# Patient Record
Sex: Female | Born: 1970
Health system: Southern US, Community
[De-identification: ages and names within clinical notes are randomized; demographics above are authoritative.]

## PROBLEM LIST (undated history)

## (undated) ENCOUNTER — Ambulatory Visit

## (undated) DIAGNOSIS — M65311 Trigger thumb, right thumb: Secondary | ICD-10-CM

## (undated) DIAGNOSIS — G5601 Carpal tunnel syndrome, right upper limb: Secondary | ICD-10-CM

## (undated) DIAGNOSIS — R251 Tremor, unspecified: Secondary | ICD-10-CM

## (undated) DIAGNOSIS — K219 Gastro-esophageal reflux disease without esophagitis: Secondary | ICD-10-CM

## (undated) HISTORY — PX: OTHER SURGICAL HISTORY: SHX169

## (undated) HISTORY — DX: Gastro-esophageal reflux disease without esophagitis: K21.9

## (undated) HISTORY — DX: Carpal tunnel syndrome, right upper limb: G56.01

## (undated) HISTORY — DX: Trigger thumb, right thumb: M65.311

## (undated) HISTORY — DX: Tremor, unspecified: R25.1

---

## 2011-01-05 ENCOUNTER — Emergency Department (HOSPITAL_COMMUNITY)
Admission: EM | Admit: 2011-01-05 | Discharge: 2011-01-05 | Disposition: A | Discharge status: Home / Self Care | Payer: 59 | Attending: Emergency Medicine | Admitting: Emergency Medicine

## 2011-01-05 ENCOUNTER — Emergency Department (HOSPITAL_COMMUNITY): Payer: 59

## 2011-01-05 DIAGNOSIS — R0789 Other chest pain: Secondary | ICD-10-CM | POA: Insufficient documentation

## 2011-05-09 ENCOUNTER — Emergency Department (HOSPITAL_COMMUNITY)
Admission: EM | Admit: 2011-05-09 | Discharge: 2011-05-10 | Disposition: A | Discharge status: Home / Self Care | Payer: 59 | Attending: Emergency Medicine | Admitting: Emergency Medicine

## 2011-05-09 DIAGNOSIS — H9190 Unspecified hearing loss, unspecified ear: Secondary | ICD-10-CM | POA: Insufficient documentation

## 2011-05-09 DIAGNOSIS — H6120 Impacted cerumen, unspecified ear: Secondary | ICD-10-CM | POA: Insufficient documentation

## 2011-12-17 ENCOUNTER — Other Ambulatory Visit: Payer: Self-pay | Admitting: Obstetrics and Gynecology

## 2011-12-17 DIAGNOSIS — Z1231 Encounter for screening mammogram for malignant neoplasm of breast: Secondary | ICD-10-CM

## 2012-01-09 ENCOUNTER — Ambulatory Visit
Admission: RE | Admit: 2012-01-09 | Discharge: 2012-01-09 | Disposition: A | Discharge status: Home / Self Care | Payer: 59 | Source: Ambulatory Visit | Attending: Obstetrics and Gynecology | Admitting: Obstetrics and Gynecology

## 2012-01-09 DIAGNOSIS — Z1231 Encounter for screening mammogram for malignant neoplasm of breast: Secondary | ICD-10-CM

## 2012-01-15 ENCOUNTER — Other Ambulatory Visit: Payer: Self-pay | Admitting: Obstetrics and Gynecology

## 2012-01-15 DIAGNOSIS — R928 Other abnormal and inconclusive findings on diagnostic imaging of breast: Secondary | ICD-10-CM

## 2012-01-24 ENCOUNTER — Ambulatory Visit
Admission: RE | Admit: 2012-01-24 | Discharge: 2012-01-24 | Disposition: A | Discharge status: Home / Self Care | Payer: 59 | Source: Ambulatory Visit | Attending: Obstetrics and Gynecology | Admitting: Obstetrics and Gynecology

## 2012-01-24 DIAGNOSIS — R928 Other abnormal and inconclusive findings on diagnostic imaging of breast: Secondary | ICD-10-CM

## 2012-05-26 ENCOUNTER — Ambulatory Visit (INDEPENDENT_AMBULATORY_CARE_PROVIDER_SITE_OTHER): Payer: 59 | Admitting: Family Medicine

## 2012-05-26 VITALS — BP 112/70 | HR 81 | Temp 98.5°F | Resp 18 | Ht 64.5 in | Wt 184.4 lb

## 2012-05-26 DIAGNOSIS — N912 Amenorrhea, unspecified: Secondary | ICD-10-CM

## 2012-05-26 DIAGNOSIS — Z331 Pregnant state, incidental: Secondary | ICD-10-CM

## 2012-05-26 LAB — POCT URINE PREGNANCY: Preg Test, Ur: POSITIVE

## 2012-05-26 NOTE — Progress Notes (Signed)
Patient ID: Leslie Ellison, female   DOB: 07-30-1971, 41 y.o.   MRN: [id number] Leslie Ellison is a 41 y.o. female who presents to Urgent Care today for pregnancy check:  1.  Pregnancy check:  LMP was Apr 20, 2012.  Had positive pregnancy test at home over the weekend.  Had some mild nausea today, no vomiting.  Eating and drinking well.  No abdominal pain, vaginal discharge, vaginal bleeding.    PMH reviewed.  ROS as above otherwise neg Medications reviewed. No current outpatient prescriptions on file.    Exam:  BP 112/70  Pulse 81  Temp 98.5 F (36.9 C) (Oral)  Resp 18  Ht 5' 4.5" (1.638 m)  Wt 184 lb 6.4 oz (83.643 kg)  BMI 31.16 kg/m2 Gen: Well NAD Abd: NABS, NT, ND Exts: Non edematous BL  LE, warm and well perfused.   Assessment and Plan:  1.  Positive pregnancy test: Positive test here.  She has no red flags by history or exam.  She was previously followed by Dr. Rosemary Ellison.  Recommended to call tomorrow to set up appt.  Patient agreed with plan.

## 2012-06-06 DIAGNOSIS — O26859 Spotting complicating pregnancy, unspecified trimester: Secondary | ICD-10-CM | POA: Insufficient documentation

## 2012-06-07 ENCOUNTER — Emergency Department (HOSPITAL_COMMUNITY): Payer: 59

## 2012-06-07 ENCOUNTER — Encounter (HOSPITAL_COMMUNITY): Payer: Self-pay | Admitting: *Deleted

## 2012-06-07 ENCOUNTER — Emergency Department (HOSPITAL_COMMUNITY)
Admission: EM | Admit: 2012-06-07 | Discharge: 2012-06-07 | Disposition: A | Discharge status: Home / Self Care | Payer: 59 | Attending: Emergency Medicine | Admitting: Emergency Medicine

## 2012-06-07 DIAGNOSIS — O209 Hemorrhage in early pregnancy, unspecified: Secondary | ICD-10-CM

## 2012-06-07 LAB — CBC WITH DIFFERENTIAL/PLATELET
Basophils Absolute: 0 10*3/uL (ref 0.0–0.1)
HCT: 33.4 % — ABNORMAL LOW (ref 36.0–46.0)
Lymphocytes Relative: 28 % (ref 12–46)
Lymphs Abs: 3 10*3/uL (ref 0.7–4.0)
MCV: 83.7 fL (ref 78.0–100.0)
Monocytes Absolute: 0.6 10*3/uL (ref 0.1–1.0)
Neutro Abs: 6.8 10*3/uL (ref 1.7–7.7)
RBC: 3.99 MIL/uL (ref 3.87–5.11)
RDW: 12.6 % (ref 11.5–15.5)
WBC: 10.7 10*3/uL — ABNORMAL HIGH (ref 4.0–10.5)

## 2012-06-07 LAB — WET PREP, GENITAL
Clue Cells Wet Prep HPF POC: NONE SEEN
Trich, Wet Prep: NONE SEEN
Yeast Wet Prep HPF POC: NONE SEEN

## 2012-06-07 LAB — URINE MICROSCOPIC-ADD ON

## 2012-06-07 LAB — POCT I-STAT, CHEM 8
BUN: 16 mg/dL (ref 6–23)
Calcium, Ion: 1.24 mmol/L — ABNORMAL HIGH (ref 1.12–1.23)
Chloride: 104 mEq/L (ref 96–112)
Creatinine, Ser: 1.2 mg/dL — ABNORMAL HIGH (ref 0.50–1.10)
Sodium: 139 mEq/L (ref 135–145)

## 2012-06-07 LAB — URINALYSIS, ROUTINE W REFLEX MICROSCOPIC
Bilirubin Urine: NEGATIVE
Ketones, ur: NEGATIVE mg/dL
Nitrite: NEGATIVE
Protein, ur: NEGATIVE mg/dL
Specific Gravity, Urine: 1.025 (ref 1.005–1.030)
Urobilinogen, UA: 1 mg/dL (ref 0.0–1.0)

## 2012-06-07 LAB — HCG, QUANTITATIVE, PREGNANCY: hCG, Beta Chain, Quant, S: 2068 m[IU]/mL — ABNORMAL HIGH (ref <5)

## 2012-06-07 LAB — ABO/RH: ABO/RH(D): O POS

## 2012-06-07 NOTE — ED Notes (Signed)
Pt stated cramping started today  Around after midnight, seeing some blood when she urinating. Stated seeing tinny small black clots.

## 2012-06-07 NOTE — ED Provider Notes (Signed)
History     CSN: [id number]  Arrival date & time 06/06/12  2351   First MD Initiated Contact with Patient 06/07/12 [phone]      Chief Complaint  Patient presents with  . Vaginal Bleeding    (Consider location/radiation/quality/duration/timing/severity/associated sxs/prior treatment) HPI 41 year old female presents to emergency department with vaginal bleeding. Patient is a G3 P2 at approximately 6 weeks and 5 days pregnant by LMP. Patient has followup on Monday with her OB. Tonight when using the restroom, patient reports she saw a small amount of blood when she wiped. Patient had again upon arrival here to the emergency department. Patient reports she has had some slight lower abdominal cramping. Patient reports no problems with her prior pregnancies. Patient is not on any fertility medications. She does not know her blood type. No other complaints at this time.  History reviewed. No pertinent past medical history.  History reviewed. No pertinent past surgical history.  History reviewed. No pertinent family history.  History  Substance Use Topics  . Smoking status: Never Smoker   . Smokeless tobacco: Never Used  . Alcohol Use: Yes     occasional    OB History    Grav Para Term Preterm Abortions TAB SAB Ect Mult Living                  Review of Systems  All other systems reviewed and are negative.    Allergies  Review of patient's allergies indicates no known allergies.  Home Medications   Current Outpatient Rx  Name Route Sig Dispense Refill  . PRENATAL 27-0.8 MG PO TABS Oral Take 1 tablet by mouth daily.      BP 109/63  Pulse 82  Temp 98.3 F (36.8 C) (Oral)  Resp 16  SpO2 100%  Physical Exam  Nursing note and vitals reviewed. Constitutional: She is oriented to person, place, and time. She appears well-developed and well-nourished.  HENT:  Head: Normocephalic and atraumatic.  Nose: Nose normal.  Mouth/Throat: Oropharynx is clear and moist.  Eyes:  Conjunctivae and EOM are normal. Pupils are equal, round, and reactive to light.  Neck: Normal range of motion. Neck supple. No JVD present. No tracheal deviation present. No thyromegaly present.  Cardiovascular: Normal rate, regular rhythm, normal heart sounds and intact distal pulses.  Exam reveals no gallop and no friction rub.   No murmur heard. Pulmonary/Chest: Effort normal and breath sounds normal. No stridor. No respiratory distress. She has no wheezes. She has no rales. She exhibits no tenderness.  Abdominal: Soft. Bowel sounds are normal. She exhibits no distension and no mass. There is no tenderness. There is no rebound and no guarding.  Genitourinary:       External genitalia normal.  Os closed.  Dark blood noted in vaginal vault and in/on cervix.  Bimanual normal.  Musculoskeletal: Normal range of motion. She exhibits no edema and no tenderness.  Lymphadenopathy:    She has no cervical adenopathy.  Neurological: She is oriented to person, place, and time. She exhibits normal muscle tone. Coordination normal.  Skin: Skin is dry. No rash noted. No erythema. No pallor.  Psychiatric: She has a normal mood and affect. Her behavior is normal. Judgment and thought content normal.    ED Course  Procedures (including critical care time)  Labs Reviewed  URINALYSIS, ROUTINE W REFLEX MICROSCOPIC - Abnormal; Notable for the following:    Hgb urine dipstick LARGE (*)     All other components within normal limits  POCT PREGNANCY, URINE - Abnormal; Notable for the following:    Preg Test, Ur POSITIVE (*)     All other components within normal limits  URINE MICROSCOPIC-ADD ON - Abnormal; Notable for the following:    Squamous Epithelial / LPF FEW (*)     Bacteria, UA FEW (*)     All other components within normal limits  CBC WITH DIFFERENTIAL - Abnormal; Notable for the following:    WBC 10.7 (*)     Hemoglobin 11.6 (*)     HCT 33.4 (*)     All other components within normal limits    POCT I-STAT, CHEM 8 - Abnormal; Notable for the following:    Creatinine, Ser 1.20 (*)     Calcium, Ion 1.24 (*)     All other components within normal limits  HCG, QUANTITATIVE, PREGNANCY - Abnormal; Notable for the following:    hCG, Beta Chain, Quant, S 2068 (*)     All other components within normal limits  ABO/RH  WET PREP, GENITAL  GC/CHLAMYDIA PROBE AMP, GENITAL   US Ob Comp Less 14 Wks  06/07/2012  *RADIOLOGY REPORT*  Clinical Data: Pelvic cramping and vaginal spotting.  OBSTETRIC <14 WK [place] AND TRANSVAGINAL OB US  Technique:  Both transabdominal and transvaginal ultrasound examinations were performed for complete evaluation of the gestation as well as the maternal uterus, adnexal regions, and pelvic cul-de-sac.  Transvaginal technique was performed to assess early pregnancy.  Comparison:  None.  Intrauterine gestational sac:  Visualized/normal in shape. Yolk sac: No Embryo: No Cardiac Activity: N/A  MSD: 10.8  mm  5    w 6    d        [place] EDC: 02/01/2013  Maternal uterus/adnexae: No subchorionic hemorrhage is noted.  The uterus is otherwise unremarkable in appearance.  It measures 9.4 cm in length, 5.6 cm in transverse dimension and 3.9 cm in AP dimension.  The ovaries are unremarkable in appearance.  The right ovary measures 3.0 x 1.5 x 1.2 cm, while the left ovary measures 3.1 x 1.7 x 2.4 cm.  No suspicious adnexal masses are seen; there is no evidence for ovarian torsion.  No free fluid is seen within the pelvic cul-de-sac.  IMPRESSION: Single intrauterine gestational sac noted, with a mean sac diameter of 1.1 cm, corresponding to a gestational age of [redacted] weeks 6 days. The embryo is not yet visualized.  This matches the gestational age of [redacted] weeks 5 days by LMP, reflecting an estimated date of delivery of January 26, 2013.  Original Report Authenticated By: Tonia Ghent, M.D.   US Ob Transvaginal  06/07/2012  *RADIOLOGY REPORT*  Clinical Data: Pelvic cramping and vaginal spotting.  OBSTETRIC  <14 WK [place] AND TRANSVAGINAL OB US  Technique:  Both transabdominal and transvaginal ultrasound examinations were performed for complete evaluation of the gestation as well as the maternal uterus, adnexal regions, and pelvic cul-de-sac.  Transvaginal technique was performed to assess early pregnancy.  Comparison:  None.  Intrauterine gestational sac:  Visualized/normal in shape. Yolk sac: No Embryo: No Cardiac Activity: N/A  MSD: 10.8  mm  5    w 6    d        [place] EDC: 02/01/2013  Maternal uterus/adnexae: No subchorionic hemorrhage is noted.  The uterus is otherwise unremarkable in appearance.  It measures 9.4 cm in length, 5.6 cm in transverse dimension and 3.9 cm in AP dimension.  The ovaries are unremarkable in appearance.  The right  ovary measures 3.0 x 1.5 x 1.2 cm, while the left ovary measures 3.1 x 1.7 x 2.4 cm.  No suspicious adnexal masses are seen; there is no evidence for ovarian torsion.  No free fluid is seen within the pelvic cul-de-sac.  IMPRESSION: Single intrauterine gestational sac noted, with a mean sac diameter of 1.1 cm, corresponding to a gestational age of [redacted] weeks 6 days. The embryo is not yet visualized.  This matches the gestational age of [redacted] weeks 5 days by LMP, reflecting an estimated date of delivery of January 26, 2013.  Original Report Authenticated By: Tonia Ghent, M.D.     1. First trimester bleeding       MDM  41 year old female with first trimester bleeding. We'll get transvaginal ultrasound for evaluation of bleeding along with pelvic exam.        Olivia Mackie, MD 06/07/12 [phone]

## 2012-06-07 NOTE — ED Notes (Signed)
Pt states she was told Thursday she was pregnant. Pt today she started using the bathroom wiped and noticed bleeding. Pt states mild cramps all week. Pt unsure how far along but estimates 5 weeks.

## 2012-12-29 ENCOUNTER — Other Ambulatory Visit: Payer: Self-pay | Admitting: Obstetrics and Gynecology

## 2012-12-29 DIAGNOSIS — Z1231 Encounter for screening mammogram for malignant neoplasm of breast: Secondary | ICD-10-CM

## 2013-01-22 ENCOUNTER — Ambulatory Visit
Admission: RE | Admit: 2013-01-22 | Discharge: 2013-01-22 | Disposition: A | Discharge status: Home / Self Care | Payer: 59 | Source: Ambulatory Visit | Attending: Obstetrics and Gynecology | Admitting: Obstetrics and Gynecology

## 2013-01-22 DIAGNOSIS — Z1231 Encounter for screening mammogram for malignant neoplasm of breast: Secondary | ICD-10-CM

## 2013-12-28 ENCOUNTER — Other Ambulatory Visit: Payer: Self-pay

## 2013-12-28 DIAGNOSIS — Z1231 Encounter for screening mammogram for malignant neoplasm of breast: Secondary | ICD-10-CM

## 2014-01-24 ENCOUNTER — Ambulatory Visit
Admission: RE | Admit: 2014-01-24 | Discharge: 2014-01-24 | Disposition: A | Discharge status: Home / Self Care | Payer: 59 | Source: Ambulatory Visit

## 2014-01-24 DIAGNOSIS — Z1231 Encounter for screening mammogram for malignant neoplasm of breast: Secondary | ICD-10-CM

## 2014-03-05 ENCOUNTER — Encounter (HOSPITAL_COMMUNITY): Payer: Self-pay | Admitting: Emergency Medicine

## 2014-03-05 ENCOUNTER — Emergency Department (HOSPITAL_COMMUNITY)
Admission: EM | Admit: 2014-03-05 | Discharge: 2014-03-05 | Disposition: A | Discharge status: Home / Self Care | Payer: 59 | Attending: Emergency Medicine | Admitting: Emergency Medicine

## 2014-03-05 ENCOUNTER — Emergency Department (HOSPITAL_COMMUNITY): Payer: 59

## 2014-03-05 DIAGNOSIS — Y92009 Unspecified place in unspecified non-institutional (private) residence as the place of occurrence of the external cause: Secondary | ICD-10-CM | POA: Insufficient documentation

## 2014-03-05 DIAGNOSIS — S7001XA Contusion of right hip, initial encounter: Secondary | ICD-10-CM

## 2014-03-05 DIAGNOSIS — W19XXXA Unspecified fall, initial encounter: Secondary | ICD-10-CM

## 2014-03-05 DIAGNOSIS — S7000XA Contusion of unspecified hip, initial encounter: Secondary | ICD-10-CM | POA: Insufficient documentation

## 2014-03-05 DIAGNOSIS — W172XXA Fall into hole, initial encounter: Secondary | ICD-10-CM | POA: Insufficient documentation

## 2014-03-05 DIAGNOSIS — S40021A Contusion of right upper arm, initial encounter: Secondary | ICD-10-CM

## 2014-03-05 DIAGNOSIS — Z79899 Other long term (current) drug therapy: Secondary | ICD-10-CM | POA: Insufficient documentation

## 2014-03-05 DIAGNOSIS — Y939 Activity, unspecified: Secondary | ICD-10-CM | POA: Insufficient documentation

## 2014-03-05 DIAGNOSIS — S40029A Contusion of unspecified upper arm, initial encounter: Secondary | ICD-10-CM | POA: Insufficient documentation

## 2014-03-05 MED ORDER — KETOROLAC TROMETHAMINE 30 MG/ML IJ SOLN
30.0000 mg | Freq: Once | INTRAMUSCULAR | Status: AC
  Administered 2014-03-05: 30 mg via INTRAVENOUS
  Filled 2014-03-05: qty 1

## 2014-03-05 MED ORDER — KETOROLAC TROMETHAMINE 15 MG/ML IJ SOLN: 15.0000 mg | Freq: Once | INTRAMUSCULAR | Status: DC

## 2014-03-05 MED ORDER — KETOROLAC TROMETHAMINE 60 MG/2ML IM SOLN: 60.0000 mg | Freq: Once | INTRAMUSCULAR | Status: DC

## 2014-03-05 NOTE — Discharge Instructions (Signed)

## 2014-03-05 NOTE — ED Notes (Signed)
Family at bedside. 

## 2014-03-05 NOTE — Progress Notes (Signed)
Orthopedic Tech Progress Note Patient Details:  Leslie Ellison 17-May-1971 [id number]  Patient ID: Leslie Ellison, female   DOB: 07/21/1971, 43 y.o.   MRN: [id number]   [name] 03/05/2014, 4:39 PMLevel 2 trauma.

## 2014-03-05 NOTE — ED Notes (Signed)
Pt ambulated in room without any difficulty. Just states she is sore.

## 2014-03-05 NOTE — ED Notes (Signed)
Per [facility], pt was walking out of apt on solid ground and ground gave way beneath her causing sink hole beside sewer line. Hole was hip deep. Pt caught herself with her right arm and has pain to her right arm with swelling. Positive pulses to bilateral feet. 20g to RAC. Given total of 100 mcg Fentanyl

## 2014-03-05 NOTE — ED Provider Notes (Signed)
CSN: [id number]     Arrival date & time 03/05/14  1616 History   First MD Initiated Contact with Patient 03/05/14 1623     Chief Complaint  Patient presents with  . Arm Injury   HPI 43 year old female presents complaining of arm and leg pain after a fall. She stepped into a sink hole at the apartment complex where she lives. She fell down into the sink hole up to her hip. She caught herself partially with her right arm. She complains of pain to her upper arm and her right hip. Pain is mild to moderate in severity. It is aggravated by pressing on the affected areas. Relieved by holding still. No treatments tried. No deformity noted. No significant abrasions or bruising. She denied her head. She did not lose consciousness. Denies headache, neck pain, chest pain, back pain, abdominal pain. No lacerations.  Her medical history is essentially unremarkable. She takes a birth control pill but no other medications.   History reviewed. No pertinent past medical history. History reviewed. No pertinent past surgical history. No family history on file. History  Substance Use Topics  . Smoking status: Never Smoker   . Smokeless tobacco: Never Used  . Alcohol Use: Yes     Comment: occasional   OB History   Grav Para Term Preterm Abortions TAB SAB Ect Mult Living                 Review of Systems  Constitutional: Negative for fever, chills and diaphoresis.  HENT: Negative for congestion and rhinorrhea.   Respiratory: Negative for cough, shortness of breath and wheezing.   Cardiovascular: Negative for chest pain and leg swelling.  Gastrointestinal: Negative for nausea, vomiting, abdominal pain and diarrhea.  Genitourinary: Negative for dysuria, urgency, frequency, flank pain, vaginal bleeding, vaginal discharge and difficulty urinating.  Musculoskeletal: Negative for neck pain and neck stiffness.  Skin: Negative for rash.  Neurological: Negative for weakness, numbness and headaches.  All other  systems reviewed and are negative.      Allergies  Review of patient's allergies indicates no known allergies.  Home Medications   Current Outpatient Rx  Name  Route  Sig  Dispense  Refill  . CRYSELLE-28 0.3-30 MG-MCG tablet   Oral   Take 1 tablet by mouth daily.          Marland Kitchen ibuprofen (ADVIL,MOTRIN) 200 MG tablet   Oral   Take 400 mg by mouth every 6 (six) hours as needed for headache.          BP 118/73  Pulse 86  Temp(Src) 97.8 F (36.6 C) (Oral)  Resp 20  Ht 5\' 4"  (1.626 m)  Wt 187 lb (84.823 kg)  BMI 32.08 kg/m2  SpO2 100%  LMP 02/28/2014 Physical Exam  Nursing note and vitals reviewed. Constitutional: She is oriented to person, place, and time. She appears well-developed and well-nourished. No distress.  HENT:  Head: Normocephalic and atraumatic.  Mouth/Throat: Oropharynx is clear and moist.  No bradycardia his, lacerations, or hematoma to the face or scalp. No oropharyngeal or dental injury.  Eyes: Conjunctivae and EOM are normal. Pupils are equal, round, and reactive to light. No scleral icterus.  Neck: Normal range of motion. Neck supple. No JVD present.  No C-spine tenderness  Cardiovascular: Normal rate, regular rhythm, normal heart sounds and intact distal pulses.  Exam reveals no gallop and no friction rub.   No murmur heard. Pulmonary/Chest: Effort normal and breath sounds normal. No respiratory distress. She has  no wheezes. She has no rales.  Chest wall normal to inspection with no burning or abrasions. Nontender to palpation.  Abdominal: Soft. Bowel sounds are normal. She exhibits no distension. There is no tenderness. There is no rebound and no guarding.  No abdominal wall hematoma or abrasions.  Musculoskeletal: She exhibits no edema.  Cervical, thoracic, and lumbar spine are nontender with no deformity. Her clavicles, chest wall, pelvis, arms, and legs are essentially atraumatic to inspection. She has normal range of motion in all joints. She has  no bony tenderness. She has mild tenderness over the soft tissues of her upper right arm and her upper right thigh. Otherwise she has an atraumatic musculoskeletal exam.  Neurological: She is alert and oriented to person, place, and time. No cranial nerve deficit. She exhibits normal muscle tone. Coordination normal.  Skin: Skin is warm and dry. She is not diaphoretic.  Psychiatric: She has a normal mood and affect.    ED Course  Procedures (including critical care time) Labs Review Labs Reviewed - No data to display Imaging Review Dg Hip Complete Right  03/05/2014   CLINICAL DATA:  Pain.  EXAM: RIGHT HIP - COMPLETE 2+ VIEW  COMPARISON:  None.  FINDINGS: Pelvic phleboliths. No acute bony abnormality. No evidence of fracture dislocation.  IMPRESSION: No acute abnormality .   Electronically Signed   By: Marcello Moores  Register   On: 03/05/2014 17:13   Dg Humerus Right  03/05/2014   CLINICAL DATA:  Fall, right humerus pain  EXAM: RIGHT HUMERUS - 2+ VIEW  COMPARISON:  None.  FINDINGS: There is no evidence of fracture or other focal bone lesions. Soft tissues are unremarkable.  IMPRESSION: Negative.   Electronically Signed   By: Lahoma Crocker M.D.   On: 03/05/2014 17:27     EKG Interpretation None      MDM   44 year old female fell into a syncopal. Her right leg sink into the cecal up to her hip. She partially caught herself with her right arm. She complained of some mild pain to her right upper arm and her right thigh and hip. She is brought in by EMS.  Exam she is intact airway breathing and circulation. Her vital signs within normal limits. She is well appearing and in no acute distress. She has no orthopedic deformity. She has no bony tenderness. She has mild tenderness to palpation in the soft tissues of her upper arm and her upper thigh. This is right-sided. Otherwise her exam is atraumatic and reassuring.  Obtained and films of the right humerus and pelvis. These are negative for acute fracture.  She was able to ambulate in the emergency department without difficulty and with no significant pain. She is discharged home with instructions to take NSAIDs for discomfort and to return to the emergency department for any severe pain or other worrisome symptoms. She can follow up with her primary care physician as needed.  Final diagnoses:  Fall  Contusion of right hip  Contusion of right upper arm        Wendall Papa, MD 03/05/14 1753

## 2014-03-05 NOTE — ED Provider Notes (Signed)
I saw and evaluated the patient, reviewed the resident's note and I agree with the findings and plan.   EKG Interpretation None      Pt is a 43 y.o. F with no PMH who presents emergency department with right upper arm pain and right hip pain after she fell into a small sink hole outside of her apartment today. Her entire right leg to level of her hip went into the sink hole. She did not hit her head or lose consciousness. She is neurologically intact. No chest pain, shortness of breath or abdominal pain. She is hemodynamically stable.  We'll obtain imaging of her right upper extremity, right hip.   5:46 PM  Imaging negative for acute injury.  Pt still neurologically intact and hemodynamically stable.  Will dc home.   [place], DO 03/05/14 1746

## 2014-04-12 ENCOUNTER — Encounter: Payer: Self-pay | Admitting: *Deleted

## 2014-04-13 ENCOUNTER — Encounter: Payer: Self-pay | Admitting: Neurology

## 2014-04-13 ENCOUNTER — Encounter (INDEPENDENT_AMBULATORY_CARE_PROVIDER_SITE_OTHER): Payer: Self-pay

## 2014-04-13 ENCOUNTER — Ambulatory Visit (INDEPENDENT_AMBULATORY_CARE_PROVIDER_SITE_OTHER): Payer: 59 | Admitting: Neurology

## 2014-04-13 VITALS — BP 134/74 | HR 86 | Ht 65.5 in | Wt 188.0 lb

## 2014-04-13 DIAGNOSIS — R209 Unspecified disturbances of skin sensation: Secondary | ICD-10-CM

## 2014-04-13 DIAGNOSIS — R251 Tremor, unspecified: Secondary | ICD-10-CM | POA: Insufficient documentation

## 2014-04-13 DIAGNOSIS — R259 Unspecified abnormal involuntary movements: Secondary | ICD-10-CM

## 2014-04-13 DIAGNOSIS — R202 Paresthesia of skin: Secondary | ICD-10-CM

## 2014-04-13 MED ORDER — PROPRANOLOL HCL ER 60 MG PO CP24: 60.0000 mg | ORAL_CAPSULE | Freq: Every day | ORAL | Status: DC

## 2014-04-13 NOTE — Patient Instructions (Signed)
Overall you are doing fairly well but I do want to suggest a few things today:   Remember to drink plenty of fluid, eat healthy meals and do not skip any meals. Try to eat protein with a every meal and eat a healthy snack such as fruit or nuts in between meals. Try to keep a regular sleep-wake schedule and try to exercise daily, particularly in the form of walking, 20-30 minutes a day, if you can.   As far as your medications are concerned, I would like to suggest you start taking Inderal LA 60mg  daily  Please schedule an EMG/Nerve conduction study  Please follow up in 3 months, sooner if we need to. Please call us with any interim questions, concerns, problems, updates or refill requests.   Please also call us for any test results so we can go over those with you on the phone.  My clinical assistant and will answer any of your questions and relay your messages to me and also relay most of my messages to you.   Our phone number is [phone]. We also have an after hours call service for urgent matters and there is a physician on-call for urgent questions. For any emergencies you know to call 911 or go to the nearest emergency room

## 2014-04-13 NOTE — Progress Notes (Signed)
[facility]    Provider:  Dr Janann Colonel Referring Provider: Ricke Hey, MD Primary Care Physician:  Ricke Hey, MD  CC:  Tremor after injury to right wrist  HPI:  Leslie Ellison is a right handed 43 y.o. female here as a referral from Dr. Alyson Ingles for tremor evaluation. Symptoms started after a fall March 05, 2014. 2-3 days after fall she noted tremor in the arm. Notes falling into a sink hole, feels that her forearm hit the ground. Had x-ray done, no fracture noted. Tremor is predominantly an intention/postural tremor. Gets worse with stress/anxiety. No change with caffeine. Unsure if change with EtOH. She notes difficulty using the hand but is able, has trouble writing, hand goes all over the place. Having difficulty with fine motor tasks due to the tremor. Denies any weakness in the RUE, does note some pins and needles type feeling in her whole hand. Tried propranolol 40mg  with no notable benefit but only tried for a few days. Tolerated with no side effects.   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 No family history of tremor. Otherwise healthy.   Review of Systems: Out of a complete 14 system review, the patient complains of only the following symptoms, and all other reviewed systems are negative. + headache, numbness, weakness, tremor, joint pain, joint swelling, aching muscles  History   Social History  . Marital Status: Single    Spouse Name: N/A    Number of Children: N/A  . Years of Education: N/A   Occupational History  . Not on file.   Social History Main Topics  . Smoking status: Never Smoker   . Smokeless tobacco: Never Used  . Alcohol Use: Yes     Comment: occasional  . Drug Use: No  . Sexual Activity: Not on file   Other Topics Concern  . Not on file   Social History Narrative   Single, 2 children   Right handed   Associates degree   2-3 cups     Family History  Problem Relation Age  of Onset  . Hypertension Father   . Hypertension Mother   . Diabetes Brother     Past Medical History  Diagnosis Date  . GERD (gastroesophageal reflux disease)     No past surgical history on file.  Current Outpatient Prescriptions  Medication Sig Dispense Refill  . CRYSELLE-28 0.3-30 MG-MCG tablet Take 1 tablet by mouth daily.       Marland Kitchen HYDROcodone-acetaminophen (NORCO/VICODIN) 5-325 MG per tablet Take 1 tablet by mouth every 6 (six) hours as needed for moderate pain.      Marland Kitchen ibuprofen (ADVIL,MOTRIN) 200 MG tablet Take 400 mg by mouth every 6 (six) hours as needed for headache.       No current facility-administered medications for this visit.    Allergies as of 04/13/2014  . (No Known Allergies)    Vitals: BP 134/74  Pulse 86  Ht 5' 5.5" (1.664 m)  Wt 188 lb (85.276 kg)  BMI 30.80 kg/m2 Last Weight:  Wt Readings from Last 1 Encounters:  04/13/14 188 lb (85.276 kg)   Last Height:   Ht Readings from Last 1 Encounters:  04/13/14 5' 5.5" (1.664 m)     Physical exam: Exam: Gen: NAD, conversant Eyes: anicteric sclerae, moist conjunctivae HENT: Atraumatic, oropharynx clear Neck: Trachea midline; supple,  Lungs: CTA, no wheezing, rales, rhonic  CV: RRR, no MRG Abdomen: Soft, non-tender;  Extremities: No peripheral edema  Skin: Normal temperature, no rash,  Psych: Appropriate affect, pleasant  Neuro: MS: AA&Ox3, appropriately interactive, normal affect   Speech: fluent w/o paraphasic error  Memory: good recent and remote recall  CN: PERRL, EOMI no nystagmus, no ptosis, sensation intact to LT V1-V3 bilat, face symmetric, no weakness, hearing grossly intact, palate elevates symmetrically, shoulder shrug 5/5 bilat,  tongue protrudes midline, no fasiculations noted.  Motor: normal bulk and tone Strength: 5/5  In all extremities  Coord: minimal rest tremor noted RUE, moderate postural and action tremor RUE, minimal intention tremor. Messy  handwriting, difficulty completing Archimedes spiral  Reflexes: symmetrical, bilat downgoing toes  Sens: LT intact in all extremities  Gait: posture, stance, stride and arm-swing normal. Tandem gait intact. Able to walk on heels and toes. Romberg absent.   Assessment:  After physical and neurologic examination, review of laboratory studies, imaging, neurophysiology testing and pre-existing records, assessment will be reviewed on the problem list.  Plan:  Treatment plan and additional workup will be reviewed under Problem List.  1)RUE tremor  43y/o woman presenting for initial evaluation of RUE tremor which started after a recent fall with trauma to the RUE. Unclear etiology of tremor onset, there have been isolated case reports of tremor onset after a fall. Will check EMG/NCS. Based on age and symptoms would need to consider MS, if EMG/NCS unremarkable will consider MRI brain. Start patient on Inderal LA 60mg , can titrate up as needed.   Jim Like, DO  [facility] [address] [place], [place] [address]  Phone [phone] Fax [phone]

## 2014-04-18 ENCOUNTER — Ambulatory Visit (INDEPENDENT_AMBULATORY_CARE_PROVIDER_SITE_OTHER): Payer: 59 | Admitting: Physician Assistant

## 2014-04-18 VITALS — BP 134/80 | HR 80 | Temp 98.1°F | Resp 18 | Ht 64.0 in | Wt 187.6 lb

## 2014-04-18 DIAGNOSIS — L293 Anogenital pruritus, unspecified: Secondary | ICD-10-CM

## 2014-04-18 DIAGNOSIS — B9689 Other specified bacterial agents as the cause of diseases classified elsewhere: Secondary | ICD-10-CM

## 2014-04-18 DIAGNOSIS — N898 Other specified noninflammatory disorders of vagina: Secondary | ICD-10-CM

## 2014-04-18 DIAGNOSIS — N760 Acute vaginitis: Secondary | ICD-10-CM

## 2014-04-18 LAB — POCT WET PREP WITH KOH
KOH PREP POC: NEGATIVE
TRICHOMONAS UA: NEGATIVE
Yeast Wet Prep HPF POC: NEGATIVE

## 2014-04-18 MED ORDER — METRONIDAZOLE 0.75 % VA GEL: 1.0000 | Freq: Two times a day (BID) | VAGINAL | Status: DC

## 2014-04-18 NOTE — Progress Notes (Signed)
   Subjective:    Patient ID: Leslie Ellison, female    DOB: 10/23/1971, 43 y.o.   MRN: [id number]  HPI   Leslie Ellison is a very pleasant 43 yr old female here with concern for vaginal discharge and itching.  Has noticed for about 1 week.  Discharge is clear/white-ish.  No odor.  Tried otc one day yeast treatment without relief . Leslie Ellison has a history of yeast and BV.  Feels like it typically occurs around her period - either just before or just after.  LMP about 4 wks ago, due this week.  Uses OCPs for contraception.  Leslie Ellison is sexually active with her boyfriend.  No concern for STI and declines testing today.  Leslie Ellison denies FC, NV, abd pain, dyspareunia, urinary symptoms.   Review of Systems  Constitutional: Negative for fever and chills.  Respiratory: Negative.   Cardiovascular: Negative.   Gastrointestinal: Negative for nausea, vomiting and abdominal pain.  Genitourinary: Positive for vaginal discharge. Negative for dysuria, menstrual problem and dyspareunia.  Musculoskeletal: Negative.   Skin: Negative.        Objective:   Physical Exam  Vitals reviewed. Constitutional: Leslie Ellison is oriented to person, place, and time. Leslie Ellison appears well-developed and well-nourished. No distress.  HENT:  Head: Normocephalic and atraumatic.  Eyes: Conjunctivae are normal. No scleral icterus.  Cardiovascular: Normal rate, regular rhythm and normal heart sounds.   Pulmonary/Chest: Effort normal and breath sounds normal. Leslie Ellison has no wheezes. Leslie Ellison has no rales.  Abdominal: Soft. Bowel sounds are normal. There is no tenderness.  Genitourinary: There is no rash, tenderness or lesion on the right labia. There is no rash, tenderness or lesion on the left labia. Cervix exhibits no motion tenderness, no discharge and no friability. Right adnexum displays no mass, no tenderness and no fullness. Left adnexum displays no mass, no tenderness and no fullness. Vaginal discharge (scant, thin, white) found.  Neurological: Leslie Ellison is alert and  oriented to person, place, and time.  Skin: Skin is warm and dry.  Psychiatric: Leslie Ellison has a normal mood and affect. Her behavior is normal.    Results for orders placed in visit on 04/18/14  POCT WET PREP WITH KOH      Result Value Ref Range   Trichomonas, UA Negative     Clue Cells Wet Prep HPF POC 0-2     Epithelial Wet Prep HPF POC 2-4     Yeast Wet Prep HPF POC neg     Bacteria Wet Prep HPF POC 2+     RBC Wet Prep HPF POC 1-3     WBC Wet Prep HPF POC 5-10     KOH Prep POC Negative         Assessment & Plan:  Bacterial vaginosis - Plan: metroNIDAZOLE (METROGEL) 0.75 % vaginal gel  Vaginal itching - Plan: POCT Wet Prep with KOH   Leslie Ellison is a 43 yr old female with bacterial vaginosis.  Will treat with metrogel bid x 7 days - per pt preference.  Discussed avoidance of etoh.  Discussed prevention strategies including continuously cycling OCPs or considering Mirena/Nexplanon as BV tends to be associated with menses.  Pt to call or RTC if worsening or not improving  E. Natividad Brood MHS, PA-C [facility] 5/18/20159:54 PM

## 2014-04-18 NOTE — Patient Instructions (Signed)
Use the Metrogel vaginally twice daily for 7 days.  Avoid alcohol while using this medication  Be aware of potential triggers.  We can consider continuously cycling your birth control pill to skip periods.  Or consider the Mirena IUD or Nexplanon that will also likely reduce your periods  Please let me know if any symptoms are worsening or not improving   Bacterial Vaginosis Bacterial vaginosis is a vaginal infection that occurs when the normal balance of bacteria in the vagina is disrupted. It results from an overgrowth of certain bacteria. This is the most common vaginal infection in women of childbearing age. Treatment is important to prevent complications, especially in pregnant women, as it can cause a premature delivery. CAUSES  Bacterial vaginosis is caused by an increase in harmful bacteria that are normally present in smaller amounts in the vagina. Several different kinds of bacteria can cause bacterial vaginosis. However, the reason that the condition develops is not fully understood. RISK FACTORS Certain activities or behaviors can put you at an increased risk of developing bacterial vaginosis, including:  Having a new sex partner or multiple sex partners.  Douching.  Using an intrauterine device (IUD) for contraception. Women do not get bacterial vaginosis from toilet seats, bedding, swimming pools, or contact with objects around them. SIGNS AND SYMPTOMS  Some women with bacterial vaginosis have no signs or symptoms. Common symptoms include:  Grey vaginal discharge.  A fishlike odor with discharge, especially after sexual intercourse.  Itching or burning of the vagina and vulva.  Burning or pain with urination. DIAGNOSIS  Your health care provider will take a medical history and examine the vagina for signs of bacterial vaginosis. A sample of vaginal fluid may be taken. Your health care provider will look at this sample under a microscope to check for bacteria and abnormal  cells. A vaginal pH test may also be done.  TREATMENT  Bacterial vaginosis may be treated with antibiotic medicines. These may be given in the form of a pill or a vaginal cream. A second round of antibiotics may be prescribed if the condition comes back after treatment.  HOME CARE INSTRUCTIONS   Only take over-the-counter or prescription medicines as directed by your health care provider.  If antibiotic medicine was prescribed, take it as directed. Make sure you finish it even if you start to feel better.  Do not have sex until treatment is completed.  Tell all sexual partners that you have a vaginal infection. They should see their health care provider and be treated if they have problems, such as a mild rash or itching.  Practice safe sex by using condoms and only having one sex partner. SEEK MEDICAL CARE IF:   Your symptoms are not improving after 3 days of treatment.  You have increased discharge or pain.  You have a fever. MAKE SURE YOU:   Understand these instructions.  Will watch your condition.  Will get help right away if you are not doing well or get worse. FOR MORE INFORMATION  Centers for Disease Control and Prevention, Division of STD Prevention: AppraiserFraud.fi American Sexual Health Association (ASHA): www.ashastd.org  Document Released: 11/18/2005 Document Revised: 09/08/2013 Document Reviewed: 06/30/2013 [facility] Patient Information 2014 [place].

## 2014-04-21 ENCOUNTER — Telehealth: Payer: Self-pay | Admitting: Neurology

## 2014-04-21 NOTE — Telephone Encounter (Signed)
Patient calling to check whether the fax she sent over was received by [place], regarding paperwork that needs to be filled out. Please call and advise patient.

## 2014-04-26 ENCOUNTER — Encounter (INDEPENDENT_AMBULATORY_CARE_PROVIDER_SITE_OTHER): Payer: Self-pay

## 2014-04-26 ENCOUNTER — Ambulatory Visit (INDEPENDENT_AMBULATORY_CARE_PROVIDER_SITE_OTHER): Payer: 59 | Admitting: Diagnostic Neuroimaging

## 2014-04-26 ENCOUNTER — Encounter: Payer: Self-pay | Admitting: Neurology

## 2014-04-26 DIAGNOSIS — R202 Paresthesia of skin: Secondary | ICD-10-CM

## 2014-04-26 DIAGNOSIS — Z0289 Encounter for other administrative examinations: Secondary | ICD-10-CM

## 2014-04-26 DIAGNOSIS — R209 Unspecified disturbances of skin sensation: Secondary | ICD-10-CM

## 2014-04-26 NOTE — Procedures (Signed)
   [facility]  NCS (NERVE CONDUCTION STUDY) WITH EMG (ELECTROMYOGRAPHY) REPORT   STUDY DATE: 04/26/14 PATIENT NAME: Leslie Ellison DOB: 02/08/1971 MRN: [id number]  ORDERING CLINICIAN: Jim Like, DO   TECHNOLOGIST: Laretta Alstrom ELECTROMYOGRAPHER: Earlean Polka. Seann Genther, MD  CLINICAL INFORMATION: 43 year old female with right arm tremor and numbness.  FINDINGS: NERVE CONDUCTION STUDY: Bilateral median and bilateral ulnar motor responses and F-wave latencies are normal. Bilateral median and bilateral ulnar sensory responses are normal.  NEEDLE ELECTROMYOGRAPHY: Needle examination of right upper extremity deltoid, biceps, triceps, flexor carpi radialis is normal. No abnormal spontaneous activity. Normal motor unit recruitment on exertion. Right first dorsal interosseous demonstrates fluctuating, near continuous motor unit activity with normal motor unit recruitment on exertion. No abnormal spontaneous activity at rest.  IMPRESSION:  Normal study. No evidence of large fiber neuropathy or myopathy at this time.   INTERPRETING PHYSICIAN:  Penni Bombard, MD Certified in Neurology, Neurophysiology and Neuroimaging  [facility] [address] [place], [place] [address] [phone]

## 2014-04-27 ENCOUNTER — Telehealth: Payer: Self-pay | Admitting: Neurology

## 2014-04-27 NOTE — Telephone Encounter (Signed)
Patient returning Donna's call

## 2014-04-27 NOTE — Telephone Encounter (Signed)
Spoke to patient and will give FMLA intermittently, and call Bebe Liter to explain that form may not be sent by 04-28-14.

## 2014-04-27 NOTE — Telephone Encounter (Signed)
Left message that we received FMLA paperwork, but asked that she call back to relay if she is taking her leave for a continuous period of time, or intermittently - reduced schedule, so we can complete.  Patient returned call and will take leave on intermittent basis.

## 2014-04-27 NOTE — Progress Notes (Signed)
Quick Note:  Spoke with patient, appointment is scheduled for 05/10/14 at 8 am. ______

## 2014-04-29 DIAGNOSIS — Z0289 Encounter for other administrative examinations: Secondary | ICD-10-CM

## 2014-05-10 ENCOUNTER — Encounter: Payer: Self-pay | Admitting: Neurology

## 2014-05-10 ENCOUNTER — Ambulatory Visit (INDEPENDENT_AMBULATORY_CARE_PROVIDER_SITE_OTHER): Payer: 59 | Admitting: Neurology

## 2014-05-10 ENCOUNTER — Encounter (INDEPENDENT_AMBULATORY_CARE_PROVIDER_SITE_OTHER): Payer: Self-pay

## 2014-05-10 VITALS — BP 123/80 | HR 90 | Ht 65.0 in | Wt 188.0 lb

## 2014-05-10 DIAGNOSIS — R202 Paresthesia of skin: Secondary | ICD-10-CM

## 2014-05-10 DIAGNOSIS — R209 Unspecified disturbances of skin sensation: Secondary | ICD-10-CM

## 2014-05-10 DIAGNOSIS — R251 Tremor, unspecified: Secondary | ICD-10-CM

## 2014-05-10 DIAGNOSIS — R259 Unspecified abnormal involuntary movements: Secondary | ICD-10-CM

## 2014-05-10 MED ORDER — PROPRANOLOL HCL ER 80 MG PO CP24: 80.0000 mg | ORAL_CAPSULE | Freq: Every day | ORAL | Status: DC

## 2014-05-10 NOTE — Progress Notes (Signed)
[facility]    Provider:  Dr Janann Colonel Referring Provider: Thressa Sheller, MD Primary Care Physician:  Thressa Sheller, MD  CC:  Tremor after injury to right wrist  HPI:  Leslie Ellison is a right handed 43 y.o. female here as a follow up from Dr. Noah Delaine for tremor evaluation. Last visit was 04/2014 at which time she had an EMG/NCS done which was unremarkable. Notes mild improvement with the addition of Inderal. Continues to be predominantly a postural/intention tremor. 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    Initial visit 04/2014: Symptoms started after a fall March 05, 2014. 2-3 days after fall she noted tremor in the arm. Notes falling into a sink hole, feels that her forearm hit the ground. Had x-ray done, no fracture noted. Tremor is predominantly an intention/postural tremor. Gets worse with stress/anxiety. No change with caffeine. Unsure if change with EtOH. She notes difficulty using the hand but is able, has trouble writing, hand goes all over the place. Having difficulty with fine motor tasks due to the tremor. Denies any weakness in the RUE, does note some pins and needles type feeling in her whole hand. Tried propranolol 40mg  with no notable benefit but only tried for a few days. Tolerated with no side effects.   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 No family history of tremor. Otherwise healthy.   Review of Systems: Out of a complete 14 system review, the patient complains of only the following symptoms, and all other reviewed systems are negative. + tremor  History   Social History  . Marital Status: Single    Spouse Name: N/A    Number of Children: 2  . Years of Education: college   Occupational History  .  Lab [employer]   Social History Main Topics  . Smoking status: Never Smoker   . Smokeless tobacco: Never Used  . Alcohol Use: Yes     Comment: occasional  . Drug Use: No  .  Sexual Activity: Not on file   Other Topics Concern  . Not on file   Social History Narrative   Single, 2 children   Right handed   Associates degree   2-3 cups     Family History  Problem Relation Age of Onset  . Hypertension Father   . Hypertension Mother   . Diabetes Brother     Past Medical History  Diagnosis Date  . GERD (gastroesophageal reflux disease)     No past surgical history on file.  Current Outpatient Prescriptions  Medication Sig Dispense Refill  . CRYSELLE-28 0.3-30 MG-MCG tablet Take 1 tablet by mouth daily.       Marland Kitchen HYDROcodone-acetaminophen (NORCO/VICODIN) 5-325 MG per tablet Take 1 tablet by mouth every 6 (six) hours as needed for moderate pain.      Marland Kitchen ibuprofen (ADVIL,MOTRIN) 200 MG tablet Take 400 mg by mouth every 6 (six) hours as needed for headache.      . metroNIDAZOLE (METROGEL) 0.75 % vaginal gel Place 1 Applicatorful vaginally 2 (two) times daily.  70 g  0  . propranolol ER (INDERAL LA) 60 MG 24 hr capsule Take 1 capsule (60 mg total) by mouth daily.  30 capsule  6   No current facility-administered medications for this visit.    Allergies as of 05/10/2014  . (No Known Allergies)    Vitals: BP 123/80  Pulse 90  Ht 5\' 5"  (1.651 m)  Wt 188 lb (  85.276 kg)  BMI 31.28 kg/m2  LMP 05/02/2014 Last Weight:  Wt Readings from Last 1 Encounters:  05/10/14 188 lb (85.276 kg)   Last Height:   Ht Readings from Last 1 Encounters:  05/10/14 5\' 5"  (1.651 m)     Physical exam: Exam: Gen: NAD, conversant Eyes: anicteric sclerae, moist conjunctivae HENT: Atraumatic, oropharynx clear Neck: Trachea midline; supple,  Lungs: CTA, no wheezing, rales, rhonic                          CV: RRR, no MRG Abdomen: Soft, non-tender;  Extremities: No peripheral edema  Skin: Normal temperature, no rash,  Psych: Appropriate affect, pleasant  Neuro: MS: AA&Ox3, appropriately interactive, normal affect   Speech: fluent w/o paraphasic error  Memory:  good recent and remote recall  CN: PERRL, EOMI no nystagmus, no ptosis, sensation intact to LT V1-V3 bilat, face symmetric, no weakness, hearing grossly intact, palate elevates symmetrically, shoulder shrug 5/5 bilat,  tongue protrudes midline, no fasiculations noted.  Motor: normal bulk and tone Strength: 5/5  In all extremities  Coord: minimal rest tremor noted RUE, moderate postural and action tremor RUE, minimal intention tremor. Messy handwriting, difficulty completing Archimedes spiral  Reflexes: symmetrical, bilat downgoing toes  Sens: LT intact in all extremities  Gait: posture, stance, stride and arm-swing normal. Tandem gait intact. Able to walk on heels and toes. Romberg absent.   Assessment:  After physical and neurologic examination, review of laboratory studies, imaging, neurophysiology testing and pre-existing records, assessment will be reviewed on the problem list.  Plan:  Treatment plan and additional workup will be reviewed under Problem List.  1)RUE tremor 2)Paresthesias  43y/o woman presenting for follow up evaluation of RUE tremor which started after a recent fall with trauma to the RUE. Unclear etiology of tremor onset, there have been isolated case reports of tremor onset after a fall with injury to the peripheral nerves. EMG/NCS was unremarkable. Based on age and symptoms would need to consider MS, will check MRI brain. Increase Inderal LA to 0mg , can titrate up as needed.   Jim Like, DO  [facility] [address] [place], [place] [address]  Phone [phone] Fax [phone]

## 2014-05-18 ENCOUNTER — Ambulatory Visit (INDEPENDENT_AMBULATORY_CARE_PROVIDER_SITE_OTHER): Payer: 59

## 2014-05-18 DIAGNOSIS — R259 Unspecified abnormal involuntary movements: Secondary | ICD-10-CM

## 2014-05-18 DIAGNOSIS — R202 Paresthesia of skin: Secondary | ICD-10-CM

## 2014-05-18 DIAGNOSIS — R251 Tremor, unspecified: Secondary | ICD-10-CM

## 2014-05-18 DIAGNOSIS — R209 Unspecified disturbances of skin sensation: Secondary | ICD-10-CM

## 2014-05-19 MED ORDER — GADOPENTETATE DIMEGLUMINE 469.01 MG/ML IV SOLN: 17.0000 mL | Freq: Once | INTRAVENOUS | Status: AC | PRN

## 2014-05-23 NOTE — Progress Notes (Signed)
Quick Note:  Spoke with patient and informed her of normal MRI of the brain, patient states that her tremors have had some improvement, but would like to know what is the next step. ______

## 2014-05-25 ENCOUNTER — Telehealth: Payer: Self-pay | Admitting: *Deleted

## 2014-05-25 ENCOUNTER — Other Ambulatory Visit: Payer: Self-pay | Admitting: Neurology

## 2014-05-25 MED ORDER — PROPRANOLOL HCL ER 120 MG PO CP24: 120.0000 mg | ORAL_CAPSULE | Freq: Every day | ORAL | Status: DC

## 2014-05-25 NOTE — Telephone Encounter (Signed)
Spoke to patient and she is aware of the Inderal increase and will call back in 4 weeks to let Dr. Janann Colonel know how she is doing.

## 2014-05-25 NOTE — Telephone Encounter (Signed)
Message copied by Vivi Barrack on Wed May 25, 2014  2:32 PM ------      Message from: Drema Dallas      Created: Wed May 25, 2014  1:25 PM       Please let her know we will increase her medication up to Inderal 120mg  once daily. The goal is to slowly increase the medication to get better control of her tremor. Please have her try the higher dose and then call us in 4 weeks to update me on her status. Thanks. ------

## 2014-08-02 ENCOUNTER — Encounter: Payer: Self-pay | Admitting: Neurology

## 2014-08-02 ENCOUNTER — Ambulatory Visit (INDEPENDENT_AMBULATORY_CARE_PROVIDER_SITE_OTHER): Payer: 59 | Admitting: Neurology

## 2014-08-02 ENCOUNTER — Encounter: Payer: Self-pay | Admitting: *Deleted

## 2014-08-02 VITALS — BP 128/81 | HR 83 | Ht 65.5 in | Wt 189.0 lb

## 2014-08-02 DIAGNOSIS — R251 Tremor, unspecified: Secondary | ICD-10-CM

## 2014-08-02 DIAGNOSIS — R259 Unspecified abnormal involuntary movements: Secondary | ICD-10-CM

## 2014-08-02 MED ORDER — PROPRANOLOL HCL ER 160 MG PO CP24: 160.0000 mg | ORAL_CAPSULE | Freq: Every day | ORAL | Status: DC

## 2014-08-02 NOTE — Patient Instructions (Signed)
Overall you are doing fairly well but I do want to suggest a few things today:   Remember to drink plenty of fluid, eat healthy meals and do not skip any meals. Try to eat protein with a every meal and eat a healthy snack such as fruit or nuts in between meals. Try to keep a regular sleep-wake schedule and try to exercise daily, particularly in the form of walking, 20-30 minutes a day, if you can.   As far as your medications are concerned, I would like to suggest we try increasing the Inderal LA to 160mg  daily  Please have some blood work completed today  I would like you to have a MRI of the cervical spine. You will be called to schedule this  Please follow up with Dr Rexene Alberts once the MRI is completed.     My clinical assistant and will answer any of your questions and relay your messages to me and also relay most of my messages to you.   Our phone number is [phone]. We also have an after hours call service for urgent matters and there is a physician on-call for urgent questions. For any emergencies you know to call 911 or go to the nearest emergency room

## 2014-08-02 NOTE — Progress Notes (Signed)
[facility]    Provider:  Dr Janann Colonel Referring Provider: Thressa Sheller, MD Primary Care Physician:  Thressa Sheller, MD  CC:  Tremor after injury to right wrist  HPI:  Leslie Ellison is a right handed 43 y.o. female here as a follow up from Dr. Noah Delaine for tremor evaluation. Last visit was 05/2014 at which time her Inderal was increased to 120mg  once daily. she had an EMG/NCS done which was unremarkable. MRI brain also unremarkable. Notes mild improvement with the addition of Inderal but continues to have difficulty with working and the tremor. It is exacerbated by stress and anxiety.    Initial visit 04/2014: Symptoms started after a fall March 05, 2014. 2-3 days after fall she noted tremor in the arm. Notes falling into a sink hole, feels that her forearm hit the ground. Had x-ray done, no fracture noted. Tremor is predominantly an intention/postural tremor. Gets worse with stress/anxiety. No change with caffeine. Unsure if change with EtOH. She notes difficulty using the hand but is able, has trouble writing, hand goes all over the place. Having difficulty with fine motor tasks due to the tremor. Denies any weakness in the RUE, does note some pins and needles type feeling in her whole hand. Tried propranolol 40mg  with no notable benefit but only tried for a few days. Tolerated with no side effects.   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 No family history of tremor. Otherwise healthy.   Review of Systems: Out of a complete 14 system review, the patient complains of only the following symptoms, and all other reviewed systems are negative. + tremor  History   Social History  . Marital Status: Single    Spouse Name: N/A    Number of Children: 2  . Years of Education: college   Occupational History  .  Lab [employer]   Social History Main Topics  . Smoking status: Never Smoker   . Smokeless tobacco: Never Used  .  Alcohol Use: Yes     Comment: occasional  . Drug Use: No  . Sexual Activity: Not on file   Other Topics Concern  . Not on file   Social History Narrative   Single, 2 children   Right handed   Associates degree   2-3 cups     Family History  Problem Relation Age of Onset  . Hypertension Father   . Hypertension Mother   . Diabetes Brother     Past Medical History  Diagnosis Date  . GERD (gastroesophageal reflux disease)     Past Surgical History  Procedure Laterality Date  . None      Current Outpatient Prescriptions  Medication Sig Dispense Refill  . CRYSELLE-28 0.3-30 MG-MCG tablet Take 1 tablet by mouth daily.       Marland Kitchen ibuprofen (ADVIL,MOTRIN) 200 MG tablet Take 400 mg by mouth every 6 (six) hours as needed for headache.      . propranolol ER (INDERAL LA) 120 MG 24 hr capsule Take 1 capsule (120 mg total) by mouth daily.  30 capsule  6   No current facility-administered medications for this visit.    Allergies as of 08/02/2014  . (No Known Allergies)    Vitals: BP 128/81  Pulse 83  Ht 5' 5.5" (1.664 m)  Wt 189 lb (85.73 kg)  BMI 30.96 kg/m2 Last Weight:  Wt Readings from Last 1 Encounters:  08/02/14 189 lb (85.73 kg)   Last Height:  Ht Readings from Last 1 Encounters:  08/02/14 5' 5.5" (1.664 m)     Physical exam: Exam: Gen: NAD, conversant Eyes: anicteric sclerae, moist conjunctivae HENT: Atraumatic, oropharynx clear Neck: Trachea midline; supple,  Lungs: CTA, no wheezing, rales, rhonic                          CV: RRR, no MRG Abdomen: Soft, non-tender;  Extremities: No peripheral edema  Skin: Normal temperature, no rash,  Psych: Appropriate affect, pleasant  Neuro: MS: AA&Ox3, appropriately interactive, normal affect   Speech: fluent w/o paraphasic error  Memory: good recent and remote recall  CN: PERRL, EOMI no nystagmus, no ptosis, sensation intact to LT V1-V3 bilat, face symmetric, no weakness, hearing grossly intact, palate  elevates symmetrically, shoulder shrug 5/5 bilat,  tongue protrudes midline, no fasiculations noted.  Motor: normal bulk and tone Strength: 5/5  In all extremities  Coord: minimal rest tremor noted RUE, moderate postural and action tremor RUE, minimal intention tremor. Messy handwriting, difficulty completing Archimedes spiral  Reflexes: symmetrical, bilat downgoing toes  Sens: LT intact in all extremities  Gait: posture, stance, stride and arm-swing normal. Tandem gait intact. Able to walk on heels and toes. Romberg absent.   Assessment:  After physical and neurologic examination, review of laboratory studies, imaging, neurophysiology testing and pre-existing records, assessment will be reviewed on the problem list.  Plan:  Treatment plan and additional workup will be reviewed under Problem List.  1)RUE tremor 2)Paresthesias  43y/o woman presenting for follow up evaluation of RUE tremor which started after a recent fall with trauma to the RUE. Unclear etiology of tremor onset, there have been isolated case reports of tremor onset after a fall with injury to the peripheral nerves. EMG/NCS was unremarkable. MRI brain unremarkable. Will increase Inderal LA to 160mg  daily. Will check MRI C spine. Follow up with Dr Rexene Alberts.    Jim Like, DO  [facility] [address] [place], [place] [address]  Phone [phone] Fax [phone]

## 2014-08-03 ENCOUNTER — Other Ambulatory Visit: Payer: Self-pay | Admitting: Neurology

## 2014-08-04 LAB — COMPREHENSIVE METABOLIC PANEL
A/G RATIO: 1.6 (ref 1.1–2.5)
ALBUMIN: 4.1 g/dL (ref 3.5–5.5)
ALT: 11 IU/L (ref 0–32)
AST: 12 IU/L (ref 0–40)
Alkaline Phosphatase: 38 IU/L — ABNORMAL LOW (ref 39–117)
BILIRUBIN TOTAL: 0.6 mg/dL (ref 0.0–1.2)
BUN/Creatinine Ratio: 10 (ref 9–23)
BUN: 11 mg/dL (ref 6–24)
CALCIUM: 9 mg/dL (ref 8.7–10.2)
CO2: 21 mmol/L (ref 18–29)
CREATININE: 1.08 mg/dL — AB (ref 0.57–1.00)
Chloride: 103 mmol/L (ref 97–108)
GFR, EST AFRICAN AMERICAN: 73 mL/min/{1.73_m2} (ref >59)
GFR, EST NON AFRICAN AMERICAN: 63 mL/min/{1.73_m2} (ref >59)
GLOBULIN, TOTAL: 2.5 g/dL (ref 1.5–4.5)
GLUCOSE: 99 mg/dL (ref 65–99)
POTASSIUM: 4.1 mmol/L (ref 3.5–5.2)
Sodium: 141 mmol/L (ref 134–144)
Total Protein: 6.6 g/dL (ref 6.0–8.5)

## 2014-08-04 LAB — AMMONIA: Ammonia: 62 ug/dL (ref 19–87)

## 2014-08-04 LAB — TSH: TSH: 0.969 u[IU]/mL (ref 0.450–4.500)

## 2014-08-10 ENCOUNTER — Other Ambulatory Visit: Payer: 59

## 2015-01-26 ENCOUNTER — Ambulatory Visit (INDEPENDENT_AMBULATORY_CARE_PROVIDER_SITE_OTHER): Payer: 59 | Admitting: Sports Medicine

## 2015-01-26 VITALS — BP 118/76 | HR 83 | Temp 98.0°F | Resp 24 | Ht 64.0 in | Wt 181.1 lb

## 2015-01-26 DIAGNOSIS — J309 Allergic rhinitis, unspecified: Secondary | ICD-10-CM

## 2015-01-26 DIAGNOSIS — H6120 Impacted cerumen, unspecified ear: Secondary | ICD-10-CM | POA: Insufficient documentation

## 2015-01-26 DIAGNOSIS — H6123 Impacted cerumen, bilateral: Secondary | ICD-10-CM

## 2015-01-26 MED ORDER — AMOXICILLIN 875 MG PO TABS
875.0000 mg | ORAL_TABLET | Freq: Two times a day (BID) | ORAL | Status: DC
Start: 2015-01-26 — End: 2015-06-19

## 2015-01-26 MED ORDER — FLUTICASONE PROPIONATE 50 MCG/ACT NA SUSP: 2.0000 | Freq: Every day | NASAL | Status: DC

## 2015-01-26 NOTE — Patient Instructions (Signed)
Start taking your Flonase Nasal Spray Take 3 Ibuprofen three times per day or 2 Aleeve twice per day for the next 3-4 days then as needed Do not pick up your Antibiotic Rx unless you are worsening  Allergic Rhinitis Allergic rhinitis is when the mucous membranes in the nose respond to allergens. Allergens are particles in the air that cause your body to have an allergic reaction. This causes you to release allergic antibodies. Through a chain of events, these eventually cause you to release histamine into the blood stream. Although meant to protect the body, it is this release of histamine that causes your discomfort, such as frequent sneezing, congestion, and an itchy, runny nose.  CAUSES  Seasonal allergic rhinitis (hay fever) is caused by pollen allergens that may come from grasses, trees, and weeds. Year-round allergic rhinitis (perennial allergic rhinitis) is caused by allergens such as house dust mites, pet dander, and mold spores.  SYMPTOMS   Nasal stuffiness (congestion).  Itchy, runny nose with sneezing and tearing of the eyes. DIAGNOSIS  Your health care provider can help you determine the allergen or allergens that trigger your symptoms. If you and your health care provider are unable to determine the allergen, skin or blood testing may be used. TREATMENT  Allergic rhinitis does not have a cure, but it can be controlled by:  Medicines and allergy shots (immunotherapy).  Avoiding the allergen. Hay fever may often be treated with antihistamines in pill or nasal spray forms. Antihistamines block the effects of histamine. There are over-the-counter medicines that may help with nasal congestion and swelling around the eyes. Check with your health care provider before taking or giving this medicine.  If avoiding the allergen or the medicine prescribed do not work, there are many new medicines your health care provider can prescribe. Stronger medicine may be used if initial measures are  ineffective. Desensitizing injections can be used if medicine and avoidance does not work. Desensitization is when a patient is given ongoing shots until the body becomes less sensitive to the allergen. Make sure you follow up with your health care provider if problems continue. HOME CARE INSTRUCTIONS It is not possible to completely avoid allergens, but you can reduce your symptoms by taking steps to limit your exposure to them. It helps to know exactly what you are allergic to so that you can avoid your specific triggers. SEEK MEDICAL CARE IF:   You have a fever.  You develop a cough that does not stop easily (persistent).  You have shortness of breath.  You start wheezing.  Symptoms interfere with normal daily activities. Document Released: 08/13/2001 Document Revised: 11/23/2013 Document Reviewed: 07/26/2013 [facility] Patient Information 2015 [place], [place]. This information is not intended to replace advice given to you by your health care provider. Make sure you discuss any questions you have with your health care provider.

## 2015-01-26 NOTE — Progress Notes (Signed)
  Leslie Ellison - 44 y.o. female MRN [id number]  Date of birth: Sep 12, 1971  SUBJECTIVE: CC:  Chief Complaint  Patient presents with  . Ear Pressure    Left   HPI: 2 weeks of worsening fullness  Associated frontal and maxillary pressure  No fevers, chills, systemic sx  No prior episodes  Rhinorrhea in AM   No medications tried  No associated hearing loss  Uses Q-tips daily  ROS: per HPI  HISTORY:  Past Medical, Surgical, Social, and Family History reviewed & updated per EMR.  Pertinent Historical Findings include:  reports that she has never smoked. She has never used smokeless tobacco. GERD,  No prior Surgereis  Tremor on Inderal  OBJECTIVE:  VS:   HT:5\' 4"  (162.6 cm)   WT:181 lb 2 oz (82.158 kg)  BMI:31.2          BP:118/76 mmHg  HR:83bpm  TEMP:98 F (36.7 C)(Oral)  RESP:99 %  PHYSICAL EXAM:  Physical Exam  Constitutional: She appears well-developed and well-nourished. No distress.  HENT:  Head: Normocephalic and atraumatic.  Right Ear: No drainage, swelling or tenderness. Tympanic membrane is injected and erythematous. Tympanic membrane is not scarred and not bulging. No middle ear effusion. No decreased hearing is noted.  Left Ear: No drainage, swelling or tenderness. Tympanic membrane is not injected, not scarred, not erythematous and not bulging.  No middle ear effusion. No decreased hearing is noted.  Ears:  Eyes: Conjunctivae are normal. Right eye exhibits no discharge. Left eye exhibits no discharge. No scleral icterus.  Neck: No JVD present. No tracheal deviation present.  Cardiovascular: Normal rate, regular rhythm, normal heart sounds and intact distal pulses.  Exam reveals no gallop and no friction rub.   No murmur heard. Pulmonary/Chest: Effort normal and breath sounds normal. No respiratory distress. She has no wheezes. She has no rales.  Abdominal: Soft.  Musculoskeletal: Normal range of motion. She exhibits no edema.  Neurological: She is  alert. She exhibits normal muscle tone.  Skin: Skin is warm and dry. No rash noted. She is not diaphoretic. No erythema. No pallor.  Psychiatric: She has a normal mood and affect. Her behavior is normal. Judgment and thought content normal.  Nursing note and vitals reviewed.   ASSESSMENT: 1. Cerumen impaction, bilateral   2. Allergic sinusitis     Problem  Cerumen Impaction  Allergic Sinusitis     Procedure Note: Impacted Cerumen Removal Dx: Bilateral Cerumen Impaction  Ceruminosis is noted.  Wax is removed by syringing and manual debridement under direct visualization. Instructions for home care to prevent wax buildup are given.   PLAN: See problem based charting & AVS for additional documentation.  Flonase, Aleeve per AVS  Rx for Amox for potential otitis media. Erythema was slightly visible prior to cerumen extraction and marked erythema and injection once fully visualized. No middle ear effusion. If no significant improvement with symptomatic tx over the following 3 days patient will fill antibiotic. > Return if symptoms worsen or fail to improve.

## 2015-01-27 NOTE — Addendum Note (Signed)
Addended by: Teresa Coombs D on: 01/27/2015 04:37 PM   Modules accepted: Level of Service

## 2015-01-28 NOTE — Progress Notes (Signed)
I have reviewed and agree with plan of care by Dr. Christie Beckers [place]

## 2015-06-19 ENCOUNTER — Ambulatory Visit (INDEPENDENT_AMBULATORY_CARE_PROVIDER_SITE_OTHER): Payer: 59 | Admitting: Family Medicine

## 2015-06-19 VITALS — BP 126/78 | HR 87 | Temp 98.0°F | Resp 18 | Ht 65.0 in | Wt 163.0 lb

## 2015-06-19 DIAGNOSIS — J209 Acute bronchitis, unspecified: Secondary | ICD-10-CM | POA: Diagnosis not present

## 2015-06-19 MED ORDER — ALBUTEROL SULFATE HFA 108 (90 BASE) MCG/ACT IN AERS: 2.0000 | INHALATION_SPRAY | RESPIRATORY_TRACT | Status: DC | PRN

## 2015-06-19 MED ORDER — GUAIFENESIN ER 1200 MG PO TB12: 1.0000 | ORAL_TABLET | Freq: Two times a day (BID) | ORAL | Status: DC | PRN

## 2015-06-19 MED ORDER — HYDROCOD POLST-CPM POLST ER 10-8 MG/5ML PO SUER: 5.0000 mL | Freq: Every evening | ORAL | Status: DC | PRN

## 2015-06-19 MED ORDER — BENZONATATE 200 MG PO CAPS: 200.0000 mg | ORAL_CAPSULE | Freq: Three times a day (TID) | ORAL | Status: DC | PRN

## 2015-06-19 NOTE — Progress Notes (Signed)
Subjective:    Patient ID: Jacqualine Mau, female    DOB: 1971-06-27, 44 y.o.   MRN: [id number] This chart was scribed for Delman Cheadle, MD by Marti Sleigh, Medical Scribe. This patient was seen in Room 12 and the patient's care was started a 6:44 PM.  Chief Complaint  Patient presents with  . Cough    started saturday   . Chest Pain    when coughing     HPI HPI Comments: ASEEL TRUXILLO is a 44 y.o. female who presents to [facility] complaining of worsening cough with associated chest congestion and mild pain that started three days ago. Pt denies smoking or hx of asthma. Pt denies nasal congestion or sinus pressure. Pt states she is heard a mild wheeze yesterday. Pt states she used an inhaler once, a long time ago, but has not used one regularly. Pt states she is using alka seltzer day/night, medication with limited relief.   Past Medical History  Diagnosis Date  . GERD (gastroesophageal reflux disease)    Current Outpatient Prescriptions on File Prior to Visit  Medication Sig Dispense Refill  . CRYSELLE-28 0.3-30 MG-MCG tablet Take 1 tablet by mouth daily.     . fluticasone (FLONASE) 50 MCG/ACT nasal spray Place 2 sprays into both nostrils daily. (Patient not taking: Reported on 06/21/2015) 16 g 6  . ibuprofen (ADVIL,MOTRIN) 200 MG tablet Take 400 mg by mouth every 6 (six) hours as needed for headache.    . propranolol ER (INDERAL LA) 160 MG SR capsule Take 1 capsule (160 mg total) by mouth daily. (Patient not taking: Reported on 06/19/2015) 30 capsule 6   No current facility-administered medications on file prior to visit.   No Known Allergies   Review of Systems  Constitutional: Negative for fever and chills.  HENT: Positive for postnasal drip. Negative for congestion, ear pain, rhinorrhea and sinus pressure.   Respiratory: Positive for cough, chest tightness and wheezing. Negative for shortness of breath.        Objective:  BP 126/78 mmHg  Pulse 87  Temp(Src) 98 F (36.7  C) (Oral)  Resp 18  Ht 5\' 5"  (1.651 m)  Wt 163 lb (73.936 kg)  BMI 27.12 kg/m2  SpO2 99%  LMP 06/14/2015  Physical Exam  Constitutional: She is oriented to person, place, and time. She appears well-developed and well-nourished. No distress.  HENT:  Head: Normocephalic and atraumatic.  Left TM with retraction. Rt with slight effusion. Oropharynx with erythma and postnasal drip.   Eyes: Pupils are equal, round, and reactive to light.  Neck: Neck supple. No thyromegaly present.  Cardiovascular: Normal rate, regular rhythm and normal heart sounds.   No murmur heard. Pulmonary/Chest: Effort normal. No respiratory distress. She has wheezes.  Lungs clear to ascultation, good air movement, faint wheezing on right.   Musculoskeletal: Normal range of motion.  Lymphadenopathy:    She has no cervical adenopathy.  Neurological: She is alert and oriented to person, place, and time. Coordination normal.  Skin: Skin is warm and dry. She is not diaphoretic.  Psychiatric: She has a normal mood and affect. Her behavior is normal.  Nursing note and vitals reviewed.       Assessment & Plan:   If no improvement in 24 hours will call in z-pac.  1. Acute bronchitis, unspecified organism     Meds ordered this encounter  Medications  . chlorpheniramine-HYDROcodone (TUSSIONEX PENNKINETIC ER) 10-8 MG/5ML SUER    Sig: Take 5 mLs by mouth  at bedtime as needed for cough.    Dispense:  120 mL    Refill:  0  . DISCONTD: benzonatate (TESSALON) 200 MG capsule    Sig: Take 1 capsule (200 mg total) by mouth 3 (three) times daily as needed for cough.    Dispense:  60 capsule    Refill:  0  . Guaifenesin (MUCINEX MAXIMUM STRENGTH) 1200 MG TB12    Sig: Take 1 tablet (1,200 mg total) by mouth every 12 (twelve) hours as needed.    Dispense:  14 tablet    Refill:  1  . albuterol (PROVENTIL HFA;VENTOLIN HFA) 108 (90 BASE) MCG/ACT inhaler    Sig: Inhale 2 puffs into the lungs every 4 (four) hours as needed for  wheezing or shortness of breath (cough, shortness of breath or wheezing.).    Dispense:  1 Inhaler    Refill:  1    I personally performed the services described in this documentation, which was scribed in my presence. The recorded information has been reviewed and considered, and addended by me as needed.  Delman Cheadle, MD MPH

## 2015-06-19 NOTE — Patient Instructions (Signed)

## 2015-06-21 ENCOUNTER — Telehealth: Payer: Self-pay

## 2015-06-21 ENCOUNTER — Ambulatory Visit (INDEPENDENT_AMBULATORY_CARE_PROVIDER_SITE_OTHER): Payer: 59 | Admitting: Family Medicine

## 2015-06-21 VITALS — BP 138/80 | HR 96 | Temp 98.1°F | Resp 16 | Ht 64.0 in | Wt 161.8 lb

## 2015-06-21 DIAGNOSIS — T7840XA Allergy, unspecified, initial encounter: Secondary | ICD-10-CM | POA: Diagnosis not present

## 2015-06-21 MED ORDER — CETIRIZINE HCL 10 MG PO TABS: 10.0000 mg | ORAL_TABLET | Freq: Every day | ORAL | Status: DC

## 2015-06-21 MED ORDER — CETIRIZINE HCL 10 MG PO TABS
10.0000 mg | ORAL_TABLET | Freq: Once | ORAL | Status: AC
  Administered 2015-06-21: 10 mg via ORAL

## 2015-06-21 MED ORDER — DEXTROMETHORPHAN POLISTIREX ER 30 MG/5ML PO SUER
30.0000 mg | Freq: Two times a day (BID) | ORAL | Status: DC
Start: 2015-06-21 — End: 2019-10-16

## 2015-06-21 MED ORDER — RANITIDINE HCL 150 MG PO CAPS: 150.0000 mg | ORAL_CAPSULE | Freq: Two times a day (BID) | ORAL | Status: DC

## 2015-06-21 MED ORDER — RANITIDINE HCL 150 MG PO TABS
300.0000 mg | ORAL_TABLET | Freq: Once | ORAL | Status: AC
  Administered 2015-06-21: 300 mg via ORAL

## 2015-06-21 NOTE — Progress Notes (Signed)
Subjective:  This chart was scribed for Delman Cheadle, MD by [facility], medical scribe at [facility].The patient was seen in exam room 03 and the patient's care was started at 8:32 PM.   Patient ID: Leslie Ellison, female    DOB: [birth year], 44 y.o.   MRN: [id number] Chief Complaint  Patient presents with  . Rash    lip, x 1 day   HPI HPI Comments: Leslie Ellison is a 43 y.o. female who presents to [facility] complaining of a rash on above her upper lip with associated swelling due to a possible allergic reaction to her albuterol inhaler and Tessalon. Pt was seen two days ago for suspected viral bronchitis. She had very faint wheezing with forced exhalation. Pt was prescribed tussionex, tessalon, mucinex and an albuterol inhaler. Today her cough has improved.  Pt called in this evening and felt she was having an allergic reaction to the inhaler so she came to the clinic. No tongues swelling, chest tightness, and difficulty breathing. She has used the Inhaler once yesterday and once today. Tessalon twice yesterday and once today. One dose of mucinex and she has been taking the tussionex at night.    Past Medical History  Diagnosis Date  . GERD (gastroesophageal reflux disease)    Current Outpatient Prescriptions on File Prior to Visit  Medication Sig Dispense Refill  . albuterol (PROVENTIL HFA;VENTOLIN HFA) 108 (90 BASE) MCG/ACT inhaler Inhale 2 puffs into the lungs every 4 (four) hours as needed for wheezing or shortness of breath (cough, shortness of breath or wheezing.). 1 Inhaler 1  . benzonatate (TESSALON) 200 MG capsule Take 1 capsule (200 mg total) by mouth 3 (three) times daily as needed for cough. 60 capsule 0  . chlorpheniramine-HYDROcodone (TUSSIONEX PENNKINETIC ER) 10-8 MG/5ML SUER Take 5 mLs by mouth at bedtime as needed for cough. 120 mL 0  . CRYSELLE-28 0.3-30 MG-MCG tablet Take 1 tablet by mouth daily.     . Guaifenesin (MUCINEX  MAXIMUM STRENGTH) 1200 MG TB12 Take 1 tablet (1,200 mg total) by mouth every 12 (twelve) hours as needed. 14 tablet 1  . fluticasone (FLONASE) 50 MCG/ACT nasal spray Place 2 sprays into both nostrils daily. (Patient not taking: Reported on 06/21/2015) 16 g 6  . ibuprofen (ADVIL,MOTRIN) 200 MG tablet Take 400 mg by mouth every 6 (six) hours as needed for headache.    . propranolol ER (INDERAL LA) 160 MG SR capsule Take 1 capsule (160 mg total) by mouth daily. (Patient not taking: Reported on 06/19/2015) 30 capsule 6   No current facility-administered medications on file prior to visit.   No Known Allergies  Depression screen [facility] 2/9 06/21/2015 06/19/2015  Decreased Interest 0 0  Down, Depressed, Hopeless 0 0  PHQ - 2 Score 0 0     Review of Systems  Constitutional: Positive for fatigue. Negative for fever and unexpected weight change.  HENT: Positive for congestion, facial swelling, postnasal drip and sinus pressure. Negative for drooling, ear pain, rhinorrhea, trouble swallowing and voice change.   Respiratory: Positive for cough. Negative for apnea, choking, chest tightness, shortness of breath, wheezing and stridor.   Cardiovascular: Negative for chest pain, palpitations and leg swelling.  Skin: Positive for color change and rash. Negative for wound.  Neurological: Positive for facial asymmetry. Negative for dizziness and light-headedness.  Hematological: Negative for adenopathy.  Psychiatric/Behavioral: Positive for sleep disturbance. Negative for dysphoric mood. The patient is not nervous/anxious.  Objective:  BP 138/80 mmHg  Pulse 96  Temp(Src) 98.1 F (36.7 C) (Oral)  Resp 16  Ht 5\' 4"  (1.626 m)  Wt 161 lb 12.8 oz (73.392 kg)  BMI 27.76 kg/m2  SpO2 98%  LMP 06/14/2015 Physical Exam  Constitutional: She is oriented to person, place, and time. She appears well-developed and well-nourished. No distress.  HENT:  Head: Normocephalic and atraumatic.  Mouth/Throat: No  posterior oropharyngeal edema or posterior oropharyngeal erythema.  No tongue swelling.  Eyes: Pupils are equal, round, and reactive to light.  Neck: Normal range of motion.  Cardiovascular: Normal rate, regular rhythm, S1 normal, S2 normal and normal heart sounds.   Pulmonary/Chest: Effort normal. No respiratory distress.  No wheezing with forced exhalation.   Musculoskeletal: Normal range of motion.  Neurological: She is alert and oriented to person, place, and time.  Skin: Skin is warm and dry.  Labrum line is red and swollen with a few papules.  Psychiatric: She has a normal mood and affect. Her behavior is normal.  Nursing note and vitals reviewed.     Assessment & Plan:   1. Allergic reaction, initial encounter   Suspect reaction to tessalon as has had tussionex and mucinex many times prior. Has never taken tessalong or used albuterol prior but I don't think that her sxs would continue for such a prolonged period after using short-acting inhaled alb.  Meds ordered this encounter  Medications  . cetirizine (ZYRTEC) tablet 10 mg    Sig:   . ranitidine (ZANTAC) tablet 300 mg    Sig:   . ranitidine (ZANTAC) 150 MG capsule    Sig: Take 1 capsule (150 mg total) by mouth 2 (two) times daily.    Dispense:  60 capsule    Refill:  5  . cetirizine (ZYRTEC) 10 MG tablet    Sig: Take 1 tablet (10 mg total) by mouth at bedtime.    Dispense:  30 tablet    Refill:  11  . dextromethorphan (DELSYM) 30 MG/5ML liquid    Sig: Take 5 mLs (30 mg total) by mouth 2 (two) times daily.    Dispense:  150 mL    Refill:  2    I personally performed the services described in this documentation, which was scribed in my presence. The recorded information has been reviewed and considered, and addended by me as needed.  Delman Cheadle, MD MPH

## 2015-06-21 NOTE — Patient Instructions (Signed)
Take the allergy medicine for 10 days.  Drug Allergy Allergic reactions to medicines are common. Some allergic reactions are mild. A delayed type of drug allergy that occurs 1 week or more after exposure to a medicine or vaccine is called serum sickness. A life-threatening, sudden (acute) allergic reaction that involves the whole body is called anaphylaxis. CAUSES  "True" drug allergies occur when there is an allergic reaction to a medicine. This is caused by overactivity of the immune system. First, the body becomes sensitized. The immune system is triggered by your first exposure to the medicine. Following this first exposure, future exposure to the same medicine may be life-threatening. Almost any medicine can cause an allergic reaction. Common ones are:  Penicillin.  Sulfonamides (sulfa drugs).  Local anesthetics.  X-ray dyes that contain iodine. SYMPTOMS  Common symptoms of a minor allergic reaction are:  Swelling around the mouth.  An itchy red rash or hives.  Vomiting or diarrhea. Anaphylaxis can cause swelling of the mouth and throat. This makes it difficult to breathe and swallow. Severe reactions can be fatal within seconds, even after exposure to only a trace amount of the drug that causes the reaction. HOME CARE INSTRUCTIONS   If you are unsure of what caused your reaction, keep a diary of foods and medicines used. Include the symptoms that followed. Avoid anything that causes reactions.  You may want to follow up with an allergy specialist after the reaction has cleared in order to be tested to confirm the allergy. It is important to confirm that your reaction is an allergy, not just a side effect to the medicine. If you have a true allergy to a medicine, this may prevent that medicine and related medicines from being given to you when you are very ill.  If you have hives or a rash:  Take medicines as directed by your caregiver.  You may use an over-the-counter  antihistamine (diphenhydramine) as needed.  Apply cold compresses to the skin or take baths in cool water. Avoid hot baths or showers.  If you are severely allergic:  Continuous observation after a severe reaction may be needed. Hospitalization is often required.  Wear a medical alert bracelet or necklace stating your allergy.  You and your family must learn how to use an anaphylaxis kit or give an epinephrine injection to temporarily treat an emergency allergic reaction. If you have had a severe reaction, always carry your epinephrine injection or anaphylaxis kit with you. This can be lifesaving if you have a severe reaction.  Do not drive or perform tasks after treatment until the medicines used to treat your reaction have worn off, or until your caregiver says it is okay. SEEK MEDICAL CARE IF:   You think you had an allergic reaction. Symptoms usually start within 30 minutes after exposure.  Symptoms are getting worse rather than better.  You develop new symptoms.  The symptoms that brought you to your caregiver return. SEEK IMMEDIATE MEDICAL CARE IF:   You have swelling of the mouth, difficulty breathing, or wheezing.  You have a tight feeling in your chest or throat.  You develop hives, swelling, or itching all over your body.  You develop severe vomiting or diarrhea.  You feel faint or pass out. This is an emergency. Use your epinephrine injection or anaphylaxis kit as you have been instructed. Call for emergency medical help. Even if you improve after the injection, you need to be examined at a hospital emergency department. MAKE SURE YOU:  Understand these instructions.  Will watch your condition.  Will get help right away if you are not doing well or get worse. Document Released: 11/18/2005 Document Revised: 02/10/2012 Document Reviewed: 04/24/2011 [facility] Patient Information 2015 [place], [place]. This information is not intended to replace advice given to you by  your health care provider. Make sure you discuss any questions you have with your health care provider.

## 2015-06-21 NOTE — Telephone Encounter (Signed)
Patient feels she is having an allergic reaction to the inhaler that was prescribe by her this past Monday at out  Office. She stated her mouth is swollen, no tightness in chest, no difficulty swollen ing,no difficult breathing. She wants to know what she should do RTC. Patient stated she was given other mediations on Monday and not sure if it is the inhaler. I advise her to seek medical attention if her symptoms worsen. She stop using the inhaler and feels she needs a different one. Patient uses [facility] on [address] and her call back number is [phone].

## 2015-06-22 NOTE — Telephone Encounter (Signed)
Left VM for pt to call back.

## 2015-06-23 NOTE — Telephone Encounter (Signed)
Pt came in.

## 2015-11-06 IMAGING — CR DG HIP COMPLETE 2+V*R*
3 series · 3 of 3 positions shown · non-contrast
Comparison: None.

CLINICAL DATA: Pain.

EXAM:
RIGHT HIP - COMPLETE 2+ VIEW

[t pelvis ap]
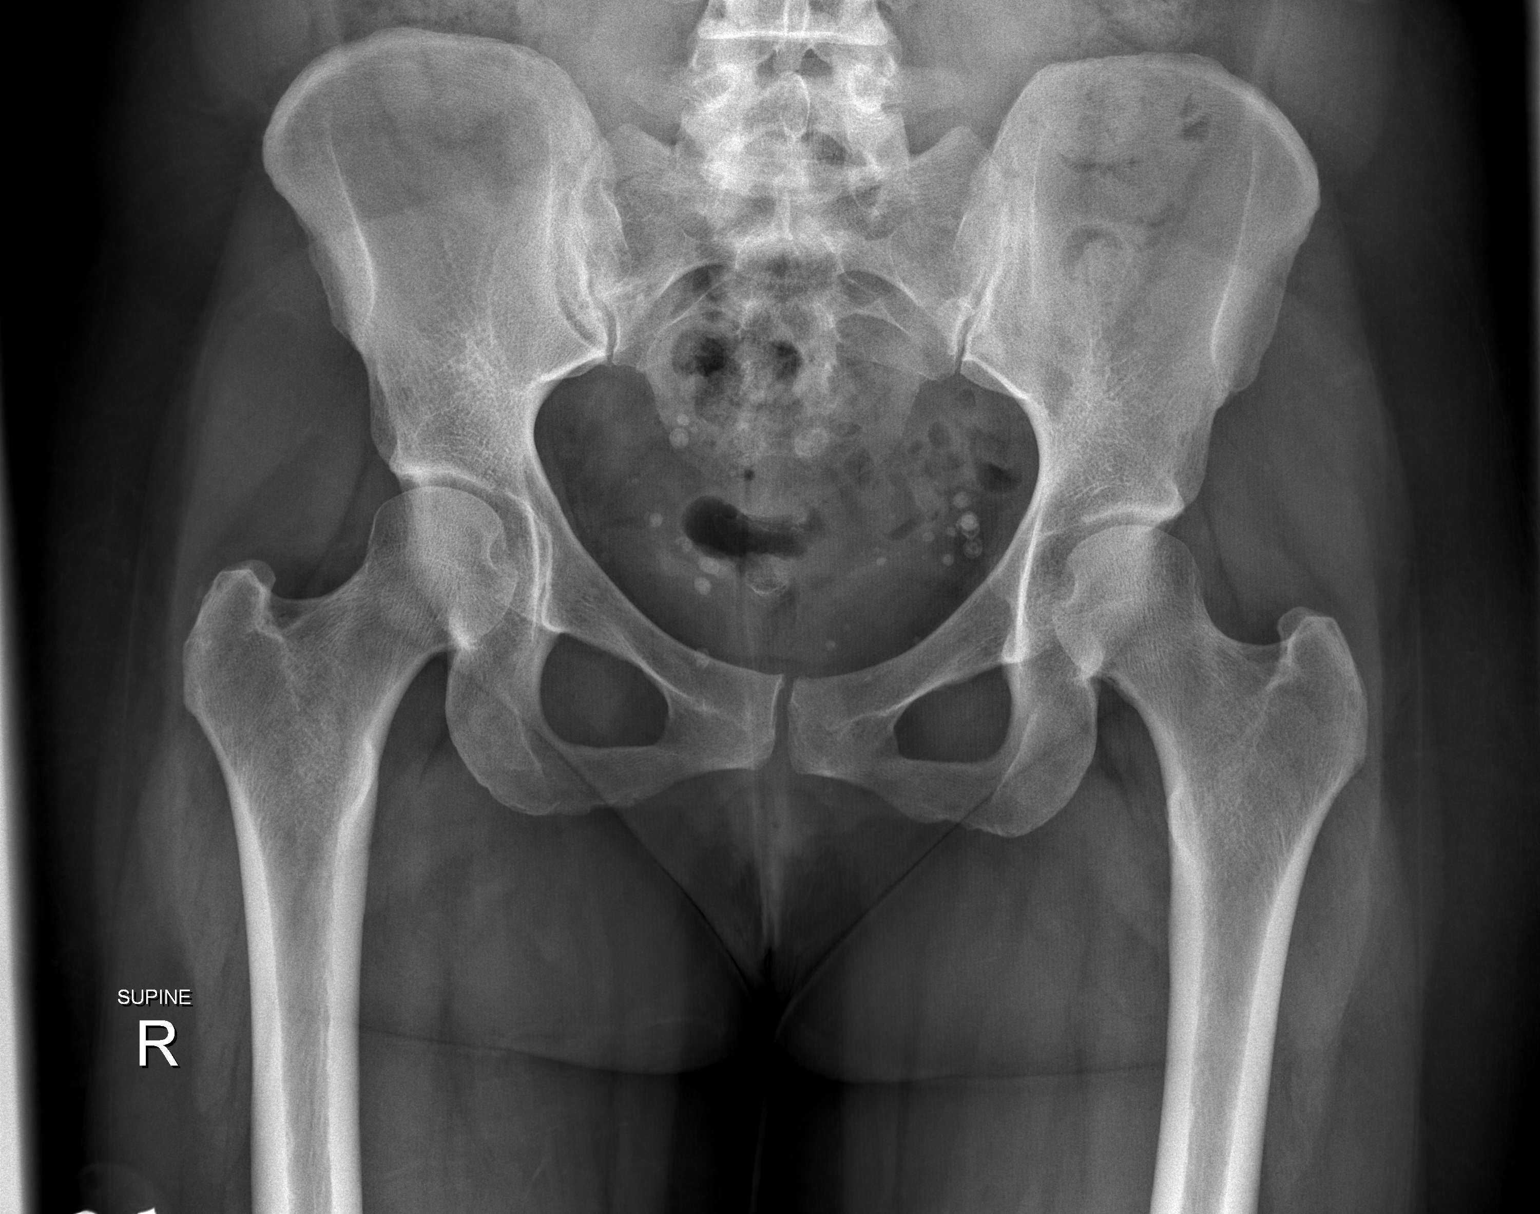

[t hip ap right]
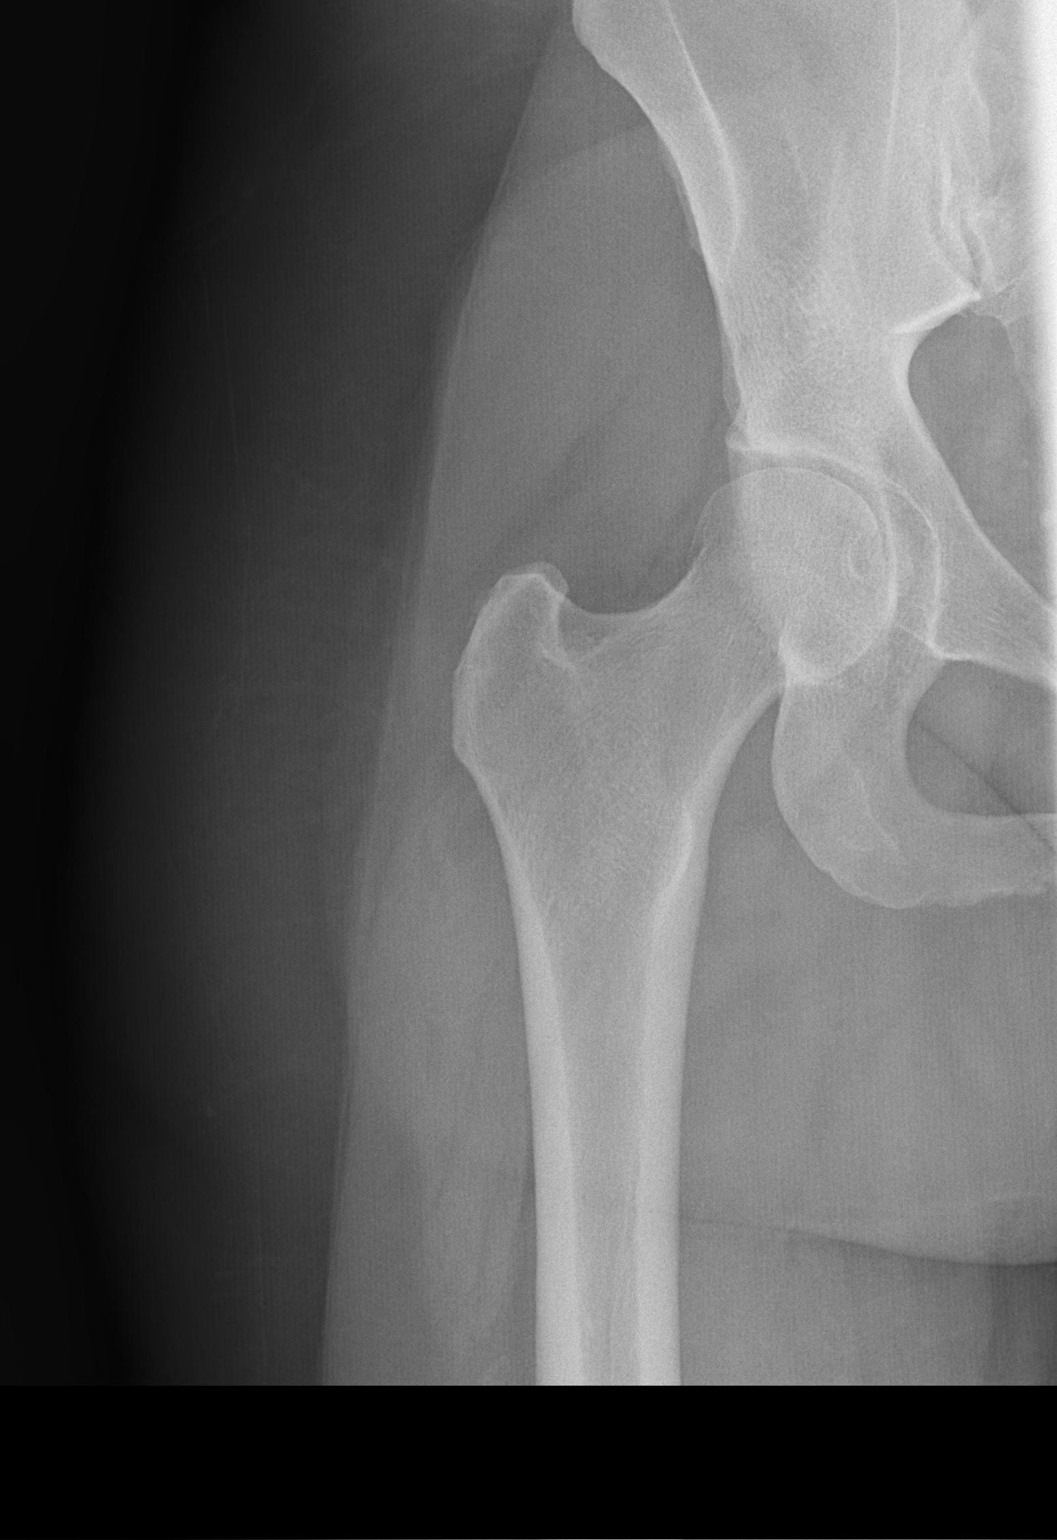

[t hip frog leg right]
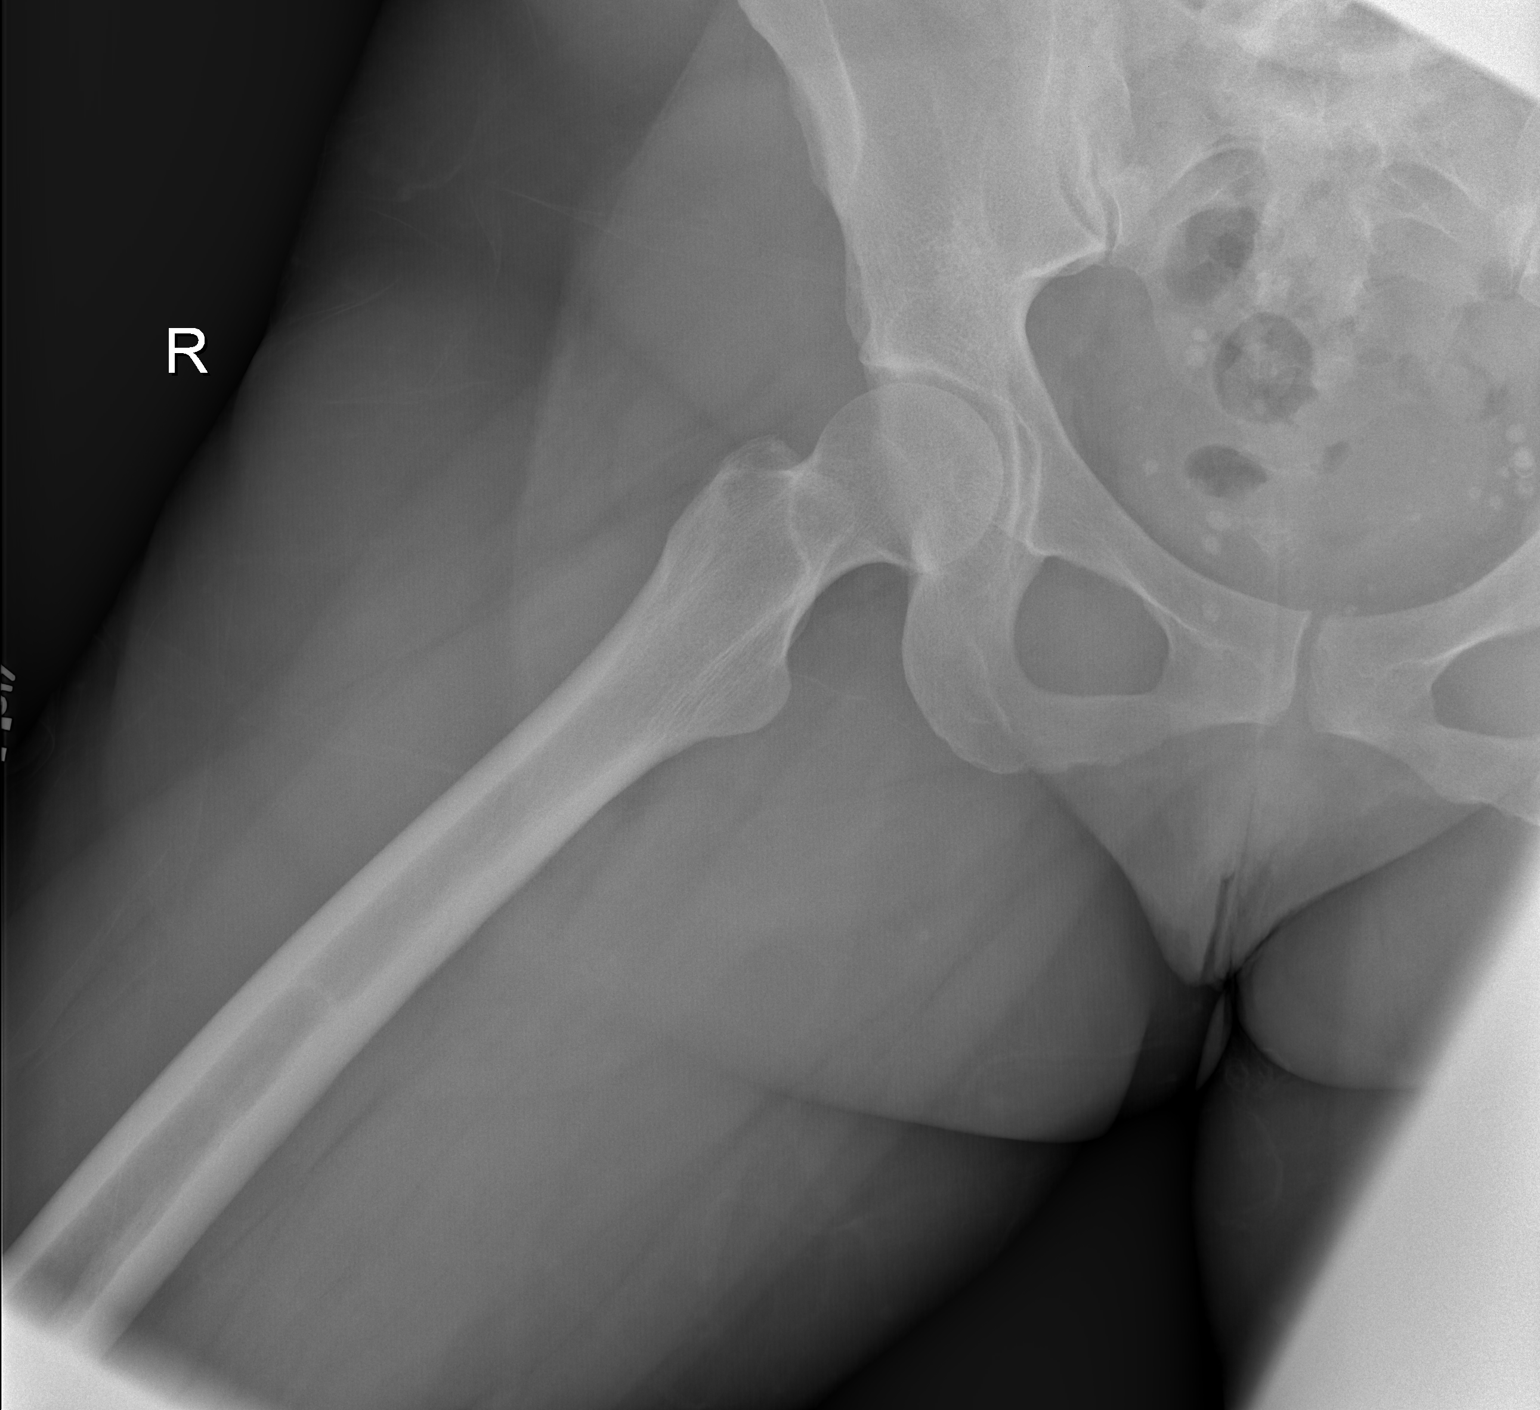

[3 of 3 positions shown; findings below may reference images not displayed]

FINDINGS: Pelvic phleboliths. No acute bony abnormality. No evidence of
fracture dislocation.
IMPRESSION: No acute abnormality .

## 2015-11-06 IMAGING — CR DG HUMERUS 2V *R*
2 series · 2 of 2 positions shown · non-contrast
Comparison: None.

CLINICAL DATA: Fall, right humerus pain

EXAM:
RIGHT HUMERUS - 2+ VIEW

[x humerus ap right]
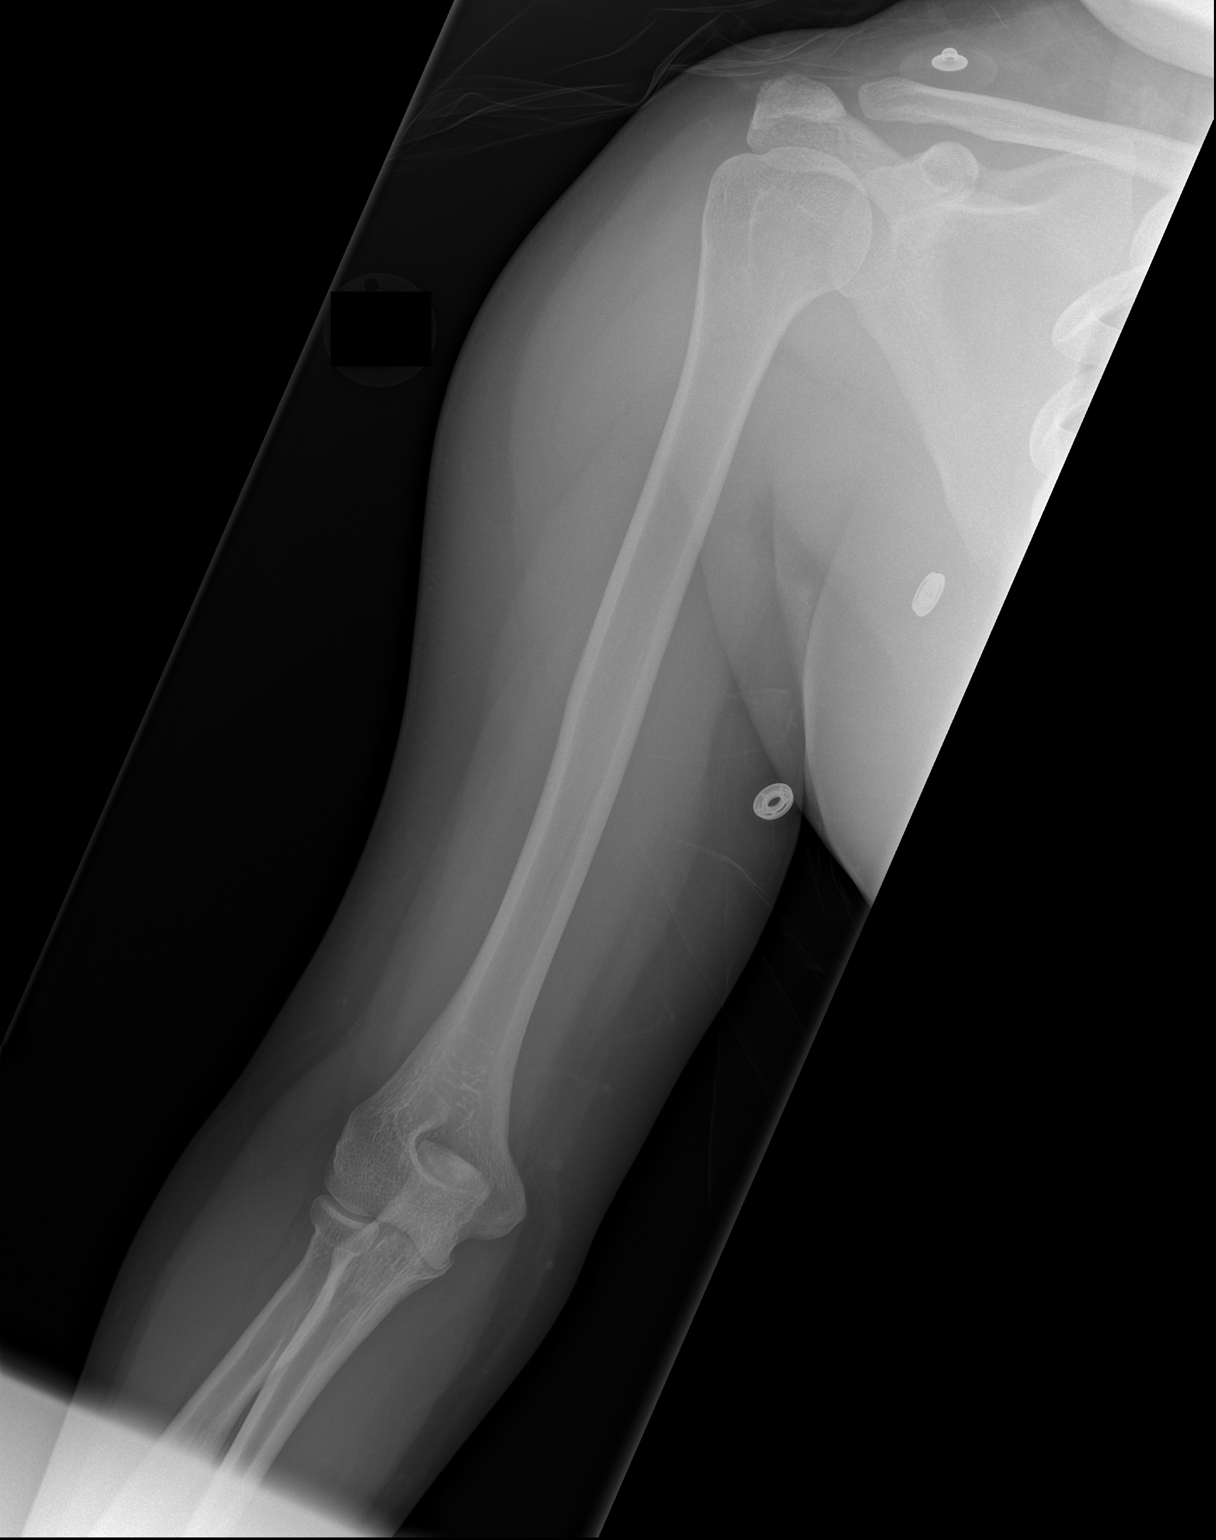

[x humerus lat right]
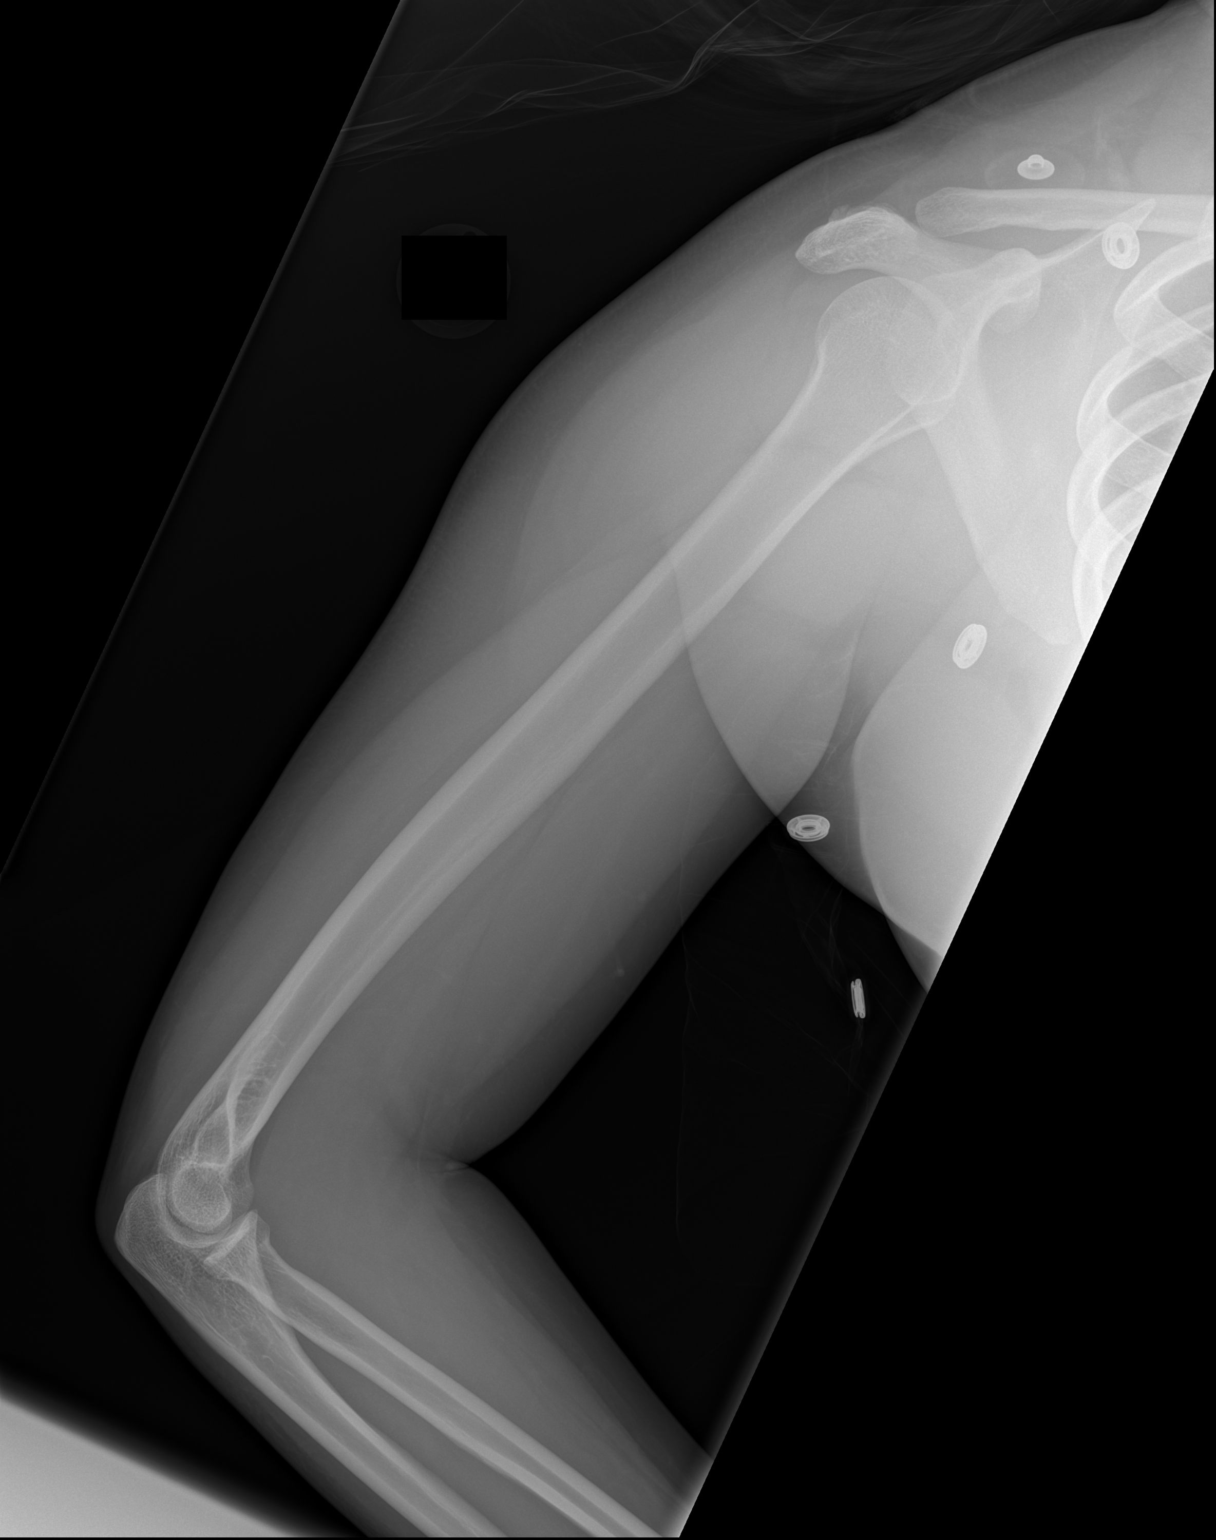

[2 of 2 positions shown; findings below may reference images not displayed]

FINDINGS: There is no evidence of fracture or other focal bone lesions. Soft
tissues are unremarkable.
IMPRESSION: Negative.

## 2016-10-21 ENCOUNTER — Emergency Department (HOSPITAL_COMMUNITY)
Admission: EM | Admit: 2016-10-21 | Discharge: 2016-10-22 | Disposition: A | Discharge status: Home / Self Care | Payer: 59 | Attending: Emergency Medicine | Admitting: Emergency Medicine

## 2016-10-21 ENCOUNTER — Encounter (HOSPITAL_COMMUNITY): Payer: Self-pay

## 2016-10-21 DIAGNOSIS — J029 Acute pharyngitis, unspecified: Secondary | ICD-10-CM | POA: Diagnosis not present

## 2016-10-21 DIAGNOSIS — Z79899 Other long term (current) drug therapy: Secondary | ICD-10-CM | POA: Diagnosis not present

## 2016-10-21 LAB — RAPID STREP SCREEN (MED CTR MEBANE ONLY): STREPTOCOCCUS, GROUP A SCREEN (DIRECT): NEGATIVE

## 2016-10-21 MED ORDER — HYDROCODONE-ACETAMINOPHEN 7.5-325 MG/15ML PO SOLN
10.0000 mL | Freq: Once | ORAL | Status: AC
  Administered 2016-10-21: 10 mL via ORAL
  Filled 2016-10-21: qty 15

## 2016-10-21 MED ORDER — IBUPROFEN 800 MG PO TABS
800.0000 mg | ORAL_TABLET | Freq: Once | ORAL | Status: AC
  Administered 2016-10-21: 800 mg via ORAL
  Filled 2016-10-21: qty 1

## 2016-10-21 NOTE — ED Provider Notes (Signed)
[facility] DEPT Provider Note   CSN: [id number] Arrival date & time: 10/21/16  2217     History   Chief Complaint Chief Complaint  Patient presents with  . Sore Throat  . Generalized Body Aches    HPI Leslie Ellison is a 45 y.o. female.  HPI  Leslie Ellison is a 45 y.o. female with PMH significant for GERD who presents with gradual onset, constant, worsening sore throat with associated nasal congestion and bilateral earache x 3 days.  No fever, chills, cough, SOB, CP, myalgias.  She states she has been taking TheraFlu and Alka Seltzer, last dose last night at 6 PM.  Symptoms worse with swallowing.  She denies choking, drooling, or voice changes. No sick contacts.   Past Medical History:  Diagnosis Date  . GERD (gastroesophageal reflux disease)     Patient Active Problem List   Diagnosis Date Noted  . Cerumen impaction 01/26/2015  . Allergic sinusitis 01/26/2015  . Tremor 04/13/2014    Past Surgical History:  Procedure Laterality Date  . None      OB History    No data available       Home Medications    Prior to Admission medications   Medication Sig Start Date End Date Taking? Authorizing Provider  albuterol (PROVENTIL HFA;VENTOLIN HFA) 108 (90 BASE) MCG/ACT inhaler Inhale 2 puffs into the lungs every 4 (four) hours as needed for wheezing or shortness of breath (cough, shortness of breath or wheezing.). 06/19/15   Shawnee Knapp, MD  cetirizine (ZYRTEC) 10 MG tablet Take 1 tablet (10 mg total) by mouth at bedtime. 06/21/15   Shawnee Knapp, MD  chlorpheniramine-HYDROcodone (TUSSIONEX PENNKINETIC ER) 10-8 MG/5ML SUER Take 5 mLs by mouth at bedtime as needed for cough. 06/19/15   Shawnee Knapp, MD  CRYSELLE-28 0.3-30 MG-MCG tablet Take 1 tablet by mouth daily.  02/14/14   Historical Provider, MD  dextromethorphan (DELSYM) 30 MG/5ML liquid Take 5 mLs (30 mg total) by mouth 2 (two) times daily. 06/21/15   Shawnee Knapp, MD  fluticasone (FLONASE) 50 MCG/ACT nasal spray  Place 2 sprays into both nostrils daily. Patient not taking: Reported on 06/21/2015 01/26/15   Gerda Diss, DO  Guaifenesin [facility] MAXIMUM STRENGTH) 1200 MG TB12 Take 1 tablet (1,200 mg total) by mouth every 12 (twelve) hours as needed. 06/19/15   Shawnee Knapp, MD  HYDROcodone-acetaminophen (HYCET) 7.5-325 mg/15 ml solution Take 10 mLs by mouth every 6 (six) hours as needed for moderate pain. 10/22/16   Gloriann Loan, PA-C  ibuprofen (ADVIL,MOTRIN) 800 MG tablet Take 1 tablet (800 mg total) by mouth 3 (three) times daily. 10/22/16   Gloriann Loan, PA-C  propranolol ER (INDERAL LA) 160 MG SR capsule Take 1 capsule (160 mg total) by mouth daily. Patient not taking: Reported on 06/19/2015 08/02/14   Drema Dallas, DO  ranitidine (ZANTAC) 150 MG capsule Take 1 capsule (150 mg total) by mouth 2 (two) times daily. 06/21/15   Shawnee Knapp, MD    Family History Family History  Problem Relation Age of Onset  . Hypertension Mother   . Hypertension Father   . Diabetes Brother     Social History Social History  Substance Use Topics  . Smoking status: Never Smoker  . Smokeless tobacco: Never Used  . Alcohol use Yes     Comment: occasional     Allergies   Patient has no known allergies.   Review of Systems Review of Systems  All other systems negative unless otherwise stated in HPI   Physical Exam Updated Vital Signs BP 126/84   Pulse 106   Temp 98 F (36.7 C) (Oral)   Resp 16   LMP 09/29/2016 (Approximate)   SpO2 100%   Physical Exam  Constitutional: She is oriented to person, place, and time. She appears well-developed and well-nourished. She is active.  Non-toxic appearance. She does not have a sickly appearance. She does not appear ill.  HENT:  Head: Normocephalic and atraumatic.  Right Ear: Tympanic membrane and external ear normal. Tympanic membrane is not erythematous and not bulging.  Left Ear: Tympanic membrane and external ear normal. Tympanic membrane is not erythematous and not  bulging.  Nose: Nose normal.  Mouth/Throat: Uvula is midline and mucous membranes are normal. No trismus in the jaw. No uvula swelling. Posterior oropharyngeal erythema present. No oropharyngeal exudate, posterior oropharyngeal edema or tonsillar abscesses.  Neck: Normal range of motion. Neck supple.  No nuchal rigidity.   Cardiovascular: Normal rate and regular rhythm.   Pulmonary/Chest: Effort normal and breath sounds normal. No respiratory distress. She has no wheezes. She has no rales.  Abdominal: Soft. Bowel sounds are normal. She exhibits no distension. There is no tenderness.  Musculoskeletal: Normal range of motion.  Lymphadenopathy:    She has no cervical adenopathy.  Neurological: She is alert and oriented to person, place, and time.  Skin: Skin is warm and dry.  Psychiatric: She has a normal mood and affect. Her behavior is normal.     ED Treatments / Results  Labs (all labs ordered are listed, but only abnormal results are displayed) Labs Reviewed  RAPID STREP SCREEN (NOT AT [facility])  CULTURE, GROUP A STREP [facility])    EKG  EKG Interpretation None       Radiology No results found.  Procedures Procedures (including critical care time)  Medications Ordered in ED Medications  ibuprofen (ADVIL,MOTRIN) tablet 800 mg (800 mg Oral Given 10/21/16 2305)  HYDROcodone-acetaminophen (HYCET) 7.5-325 mg/15 ml solution 10 mL (10 mLs Oral Given 10/21/16 2305)     Initial Impression / Assessment and Plan / ED Course  I have reviewed the triage vital signs and the nursing notes.  Pertinent labs & imaging results that were available during my care of the patient were reviewed by me and considered in my medical decision making (see chart for details).  Clinical Course    Findings consistent with pharyngitis.  Pt rapid strep test negative. Pt is tolerating secretions. Presentation not concerning for peritonsillar abscess or spread of infection to deep spaces of the throat;  patent airway. Pt will be discharged with ibuprofen and Hycet.  Specific return precautions discussed. Recommended PCP follow up. Pt appears safe for discharge.    Final Clinical Impressions(s) / ED Diagnoses   Final diagnoses:  Pharyngitis, unspecified etiology    New Prescriptions New Prescriptions   HYDROCODONE-ACETAMINOPHEN (HYCET) 7.5-325 MG/15 ML SOLUTION    Take 10 mLs by mouth every 6 (six) hours as needed for moderate pain.   IBUPROFEN (ADVIL,MOTRIN) 800 MG TABLET    Take 1 tablet (800 mg total) by mouth 3 (three) times daily.     Gloriann Loan, PA-C [id number]    Delora Fuel, MD [id number]

## 2016-10-21 NOTE — ED Triage Notes (Signed)
Pt complaining of painful swallowing and ear aches. Pt states nasal congestion and generalized body aches. Pt denies any fevers.

## 2016-10-22 MED ORDER — HYDROCODONE-ACETAMINOPHEN 7.5-325 MG/15ML PO SOLN: 10.0000 mL | Freq: Four times a day (QID) | ORAL | 0 refills | Status: DC | PRN

## 2016-10-22 MED ORDER — IBUPROFEN 800 MG PO TABS: 800.0000 mg | ORAL_TABLET | Freq: Three times a day (TID) | ORAL | 0 refills | Status: DC

## 2016-10-22 NOTE — ED Notes (Signed)
Pt verbalized understanding discharge instructions and denies any further needs or questions at this time. VS stable, ambulatory and steady gait.   

## 2016-10-22 NOTE — Discharge Instructions (Signed)
Take ibuprofen three times daily for pain.  If needed, you may take Hycet every 6 hours for severe pain.  Drink plenty of fluids.  Follow up with your primary care doctor in the next couple of days.  Return to the ED for worsening pain, fever, choking, drooling, or any new or concerning symptoms.

## 2016-10-24 LAB — CULTURE, GROUP A STREP (THRC)

## 2018-07-01 ENCOUNTER — Encounter: Payer: Self-pay | Admitting: Nurse Practitioner

## 2018-07-01 ENCOUNTER — Ambulatory Visit: Payer: Self-pay | Admitting: Nurse Practitioner

## 2018-07-01 VITALS — BP 130/82 | HR 103 | Temp 98.2°F | Wt 170.4 lb

## 2018-07-01 DIAGNOSIS — N760 Acute vaginitis: Secondary | ICD-10-CM

## 2018-07-01 DIAGNOSIS — B9689 Other specified bacterial agents as the cause of diseases classified elsewhere: Secondary | ICD-10-CM

## 2018-07-01 MED ORDER — METRONIDAZOLE 0.75 % VA GEL: 1.0000 | Freq: Every day | VAGINAL | 0 refills | Status: AC

## 2018-07-01 NOTE — Patient Instructions (Signed)
Bacterial Vaginosis Bacterial vaginosis is a vaginal infection that occurs when the normal balance of bacteria in the vagina is disrupted. It results from an overgrowth of certain bacteria. This is the most common vaginal infection among women ages 15-44. Because bacterial vaginosis increases your risk for STIs (sexually transmitted infections), getting treated can help reduce your risk for chlamydia, gonorrhea, herpes, and HIV (human immunodeficiency virus). Treatment is also important for preventing complications in pregnant women, because this condition can cause an early (premature) delivery. What are the causes? This condition is caused by an increase in harmful bacteria that are normally present in small amounts in the vagina. However, the reason that the condition develops is not fully understood. What increases the risk? The following factors may make you more likely to develop this condition:  Having a new sexual partner or multiple sexual partners.  Having unprotected sex.  Douching.  Having an intrauterine device (IUD).  Smoking.  Drug and alcohol abuse.  Taking certain antibiotic medicines.  Being pregnant.  You cannot get bacterial vaginosis from toilet seats, bedding, swimming pools, or contact with objects around you. What are the signs or symptoms? Symptoms of this condition include:  Grey or white vaginal discharge. The discharge can also be watery or foamy.  A fish-like odor with discharge, especially after sexual intercourse or during menstruation.  Itching in and around the vagina.  Burning or pain with urination.  Some women with bacterial vaginosis have no signs or symptoms. How is this diagnosed? This condition is diagnosed based on:  Your medical history.  A physical exam of the vagina.  Testing a sample of vaginal fluid under a microscope to look for a large amount of bad bacteria or abnormal cells. Your health care provider may use a cotton swab  or a small wooden spatula to collect the sample.  How is this treated? This condition is treated with antibiotics. These may be given as a pill, a vaginal cream, or a medicine that is put into the vagina (suppository). If the condition comes back after treatment, a second round of antibiotics may be needed. Follow these instructions at home: Medicines  Take over-the-counter and prescription medicines only as told by your health care provider.  Take or use your antibiotic as told by your health care provider. Do not stop taking or using the antibiotic even if you start to feel better. General instructions  If you have a female sexual partner, tell her that you have a vaginal infection. She should see her health care provider and be treated if she has symptoms. If you have a female sexual partner, he does not need treatment.  During treatment: ? Avoid sexual activity until you finish treatment. ? Do not douche. ? Avoid alcohol as directed by your health care provider. ? Avoid breastfeeding as directed by your health care provider.  Drink enough water and fluids to keep your urine clear or pale yellow.  Keep the area around your vagina and rectum clean. ? Wash the area daily with warm water. ? Wipe yourself from front to back after using the toilet.  Keep all follow-up visits as told by your health care provider. This is important. How is this prevented?  Do not douche.  Wash the outside of your vagina with warm water only.  Use protection when having sex. This includes latex condoms and dental dams.  Limit how many sexual partners you have. To help prevent bacterial vaginosis, it is best to have sex with just   one partner (monogamous).  Make sure you and your sexual partner are tested for STIs.  Wear cotton or cotton-lined underwear.  Avoid wearing tight pants and pantyhose, especially during summer.  Limit the amount of alcohol that you drink.  Do not use any products that  contain nicotine or tobacco, such as cigarettes and e-cigarettes. If you need help quitting, ask your health care provider.  Do not use illegal drugs. Where to find more information:  Centers for Disease Control and Prevention: www.cdc.gov/std  American Sexual Health Association (ASHA): www.ashastd.org  U.S. Department of Health and Human Services, Office on Women's Health: www.womenshealth.gov/ or https://www.womenshealth.gov/a-z-topics/bacterial-vaginosis Contact a health care provider if:  Your symptoms do not improve, even after treatment.  You have more discharge or pain when urinating.  You have a fever.  You have pain in your abdomen.  You have pain during sex.  You have vaginal bleeding between periods. Summary  Bacterial vaginosis is a vaginal infection that occurs when the normal balance of bacteria in the vagina is disrupted.  Because bacterial vaginosis increases your risk for STIs (sexually transmitted infections), getting treated can help reduce your risk for chlamydia, gonorrhea, herpes, and HIV (human immunodeficiency virus). Treatment is also important for preventing complications in pregnant women, because the condition can cause an early (premature) delivery.  This condition is treated with antibiotic medicines. These may be given as a pill, a vaginal cream, or a medicine that is put into the vagina (suppository). This information is not intended to replace advice given to you by your health care provider. Make sure you discuss any questions you have with your health care provider. Document Released: 11/18/2005 Document Revised: 03/24/2017 Document Reviewed: 08/03/2016 Elsevier Interactive Patient Education  2018 Elsevier Inc.  

## 2018-07-01 NOTE — Progress Notes (Signed)
Subjective:     Leslie Ellison is a 47 y.o. female who presents for evaluation of an abnormal vaginal discharge. Symptoms have been present for 2 weeks. Vaginal symptoms: discharge described as normal and physiologic and vulvar itching. Contraception: Cryselle birth control. She denies abnormal bleeding, blisters, bumps, burning, dyspareunia, pain and urinary symptoms of chills, cloudy urine, dysuria, hematuria, urinary frequency, urinary hesitancy, urinary retention, urinary urgency and fever Sexually transmitted infection risk: very low risk of STD exposure.  Patient states she does have one sexual partner and is monogamous.  Menstrual flow: regular every 28-30 days.  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  Patient states she was told she did have bacteria but no yeast.    The following portions of the patient's history were reviewed and updated as appropriate: allergies, current medications and past medical history.   Review of Systems Constitutional: negative Eyes: negative Ears, nose, mouth, throat, and face: negative Respiratory: negative Cardiovascular: negative Gastrointestinal: negative Genitourinary:positive for vaginal discharge, negative for abnormal menstrual periods and sexual problems, decreased stream, dysuria, frequency, hematuria and hesitancy    Objective:    BP 130/82   Pulse (!) 103   Temp 98.2 F (36.8 C)   Wt 170 lb 6.4 oz (77.3 kg)   SpO2 99%   BMI 29.25 kg/m  Physical Exam  Constitutional: She is oriented to person, place, and time. She appears well-developed and well-nourished. No distress.  HENT:  Head: Normocephalic and atraumatic.  Right Ear: External ear normal.  Left Ear: External ear normal.  Eyes: Pupils are equal, round, and reactive to light. Conjunctivae are normal.  Neck: Normal range of motion. Neck supple. No tracheal deviation  present. No thyromegaly present.  Cardiovascular: Normal rate, regular rhythm and normal heart sounds.  Pulmonary/Chest: Effort normal and breath sounds normal.  Abdominal: Soft. Bowel sounds are normal. She exhibits no distension. There is no tenderness.  Genitourinary: Vaginal discharge found.  Neurological: She is alert and oriented to person, place, and time.  Skin: Skin is warm and dry.  Psychiatric: She has a normal mood and affect. Her behavior is normal.    Assessment:    Bacterial vaginosis.    Plan:   Exam findings, diagnosis etiology and medication use and indications reviewed with patient. Follow- Up and discharge instructions provided. No emergent/urgent issues found on exam.  She was instructed to refrain from sexual intercourse until her treatment is completed.  Patient also instructed to follow-up if her symptoms do not begin improving in the next 3 to 4 days. Patient verbalized understanding of information provided and agrees with plan of care (POC), all questions answered.  1. Bacterial vaginitis  - metroNIDAZOLE (METROGEL) 0.75 % vaginal gel; Place 1 Applicatorful vaginally at bedtime for 7 days.  Dispense: 70 g; Refill: 0

## 2018-07-03 ENCOUNTER — Telehealth: Payer: Self-pay

## 2018-07-03 NOTE — Telephone Encounter (Signed)
Patient states she is doing good and she feels good.

## 2019-10-16 ENCOUNTER — Other Ambulatory Visit: Payer: Self-pay

## 2019-10-16 ENCOUNTER — Ambulatory Visit
Admission: EM | Admit: 2019-10-16 | Discharge: 2019-10-16 | Disposition: A | Discharge status: Home / Self Care | Payer: Managed Care, Other (non HMO) | Attending: Physician Assistant | Admitting: Physician Assistant

## 2019-10-16 ENCOUNTER — Encounter: Payer: Self-pay | Admitting: Emergency Medicine

## 2019-10-16 DIAGNOSIS — H6981 Other specified disorders of Eustachian tube, right ear: Secondary | ICD-10-CM | POA: Diagnosis not present

## 2019-10-16 MED ORDER — AZELASTINE HCL 0.1 % NA SOLN: 1.0000 | Freq: Two times a day (BID) | NASAL | 0 refills | Status: DC

## 2019-10-16 NOTE — ED Notes (Signed)
Patient able to ambulate independently  

## 2019-10-16 NOTE — ED Provider Notes (Signed)
[name]    CSN: [id number] Arrival date & time: 10/16/19  0931      History   Chief Complaint Chief Complaint  Patient presents with  . ear popping    HPI Leslie Ellison is a 48 y.o. female.   48 year old female comes in for 2-day history of right ear popping when she swallows.  States when popping occurs, can experience mild pain.  Otherwise no pain, muffled hearing, ear drainage.  She tried using cotton swab without relief.  Denies URI symptoms such as cough, congestion, sore throat.  Denies fever, chills, body aches.  Denies recent swimming.     Past Medical History:  Diagnosis Date  . GERD (gastroesophageal reflux disease)     Patient Active Problem List   Diagnosis Date Noted  . Cerumen impaction 01/26/2015  . Allergic sinusitis 01/26/2015  . Tremor 04/13/2014    Past Surgical History:  Procedure Laterality Date  . None      OB History   No obstetric history on file.      Home Medications    Prior to Admission medications   Medication Sig Start Date End Date Taking? Authorizing Provider  albuterol (PROVENTIL HFA;VENTOLIN HFA) 108 (90 BASE) MCG/ACT inhaler Inhale 2 puffs into the lungs every 4 (four) hours as needed for wheezing or shortness of breath (cough, shortness of breath or wheezing.). 06/19/15   Shawnee Knapp, MD  azelastine (ASTELIN) 0.1 % nasal spray Place 1 spray into both nostrils 2 (two) times daily. Use in each nostril as directed 10/16/19   Tasia Catchings,  V, PA-C  ibuprofen (ADVIL,MOTRIN) 800 MG tablet Take 1 tablet (800 mg total) by mouth 3 (three) times daily. 10/22/16   Gloriann Loan, PA-C  cetirizine (ZYRTEC) 10 MG tablet Take 1 tablet (10 mg total) by mouth at bedtime. 06/21/15 10/16/19  Shawnee Knapp, MD  CRYSELLE-28 0.3-30 MG-MCG tablet Take 1 tablet by mouth daily.  02/14/14 10/16/19  [provider]  fluticasone (FLONASE) 50 MCG/ACT nasal spray Place 2 sprays into both nostrils daily. Patient not taking: Reported on  06/21/2015 01/26/15 10/16/19  Gerda Diss, DO  propranolol ER (INDERAL LA) 160 MG SR capsule Take 1 capsule (160 mg total) by mouth daily. Patient not taking: Reported on 06/19/2015 08/02/14 10/16/19  Drema Dallas, DO  ranitidine (ZANTAC) 150 MG capsule Take 1 capsule (150 mg total) by mouth 2 (two) times daily. 06/21/15 10/16/19  Shawnee Knapp, MD    Family History Family History  Problem Relation Age of Onset  . Hypertension Mother   . Hypertension Father   . Diabetes Brother     Social History Social History   Tobacco Use  . Smoking status: Never Smoker  . Smokeless tobacco: Never Used  Substance Use Topics  . Alcohol use: Yes    Comment: occasional  . Drug use: No     Allergies   Patient has no known allergies.   Review of Systems Review of Systems  Reason unable to perform ROS: See HPI as above.     Physical Exam Triage Vital Signs ED Triage Vitals [10/16/19 0944]  Enc Vitals Group     BP 125/81     Pulse Rate 93     Resp 18     Temp 98.5 F (36.9 C)     Temp Source Oral     SpO2 99 %     Weight      Height      Head  Circumference      Peak Flow      Pain Score 2     Pain Loc      Pain Edu?      Excl. in [place]?    No data found.  Updated Vital Signs BP 125/81 (BP Location: Right Arm)   Pulse 93   Temp 98.5 F (36.9 C) (Oral)   Resp 18   LMP 09/22/2019   SpO2 99%   Physical Exam Constitutional:      General: She is not in acute distress.    Appearance: She is well-developed. She is not diaphoretic.  HENT:     Head: Normocephalic and atraumatic.     Right Ear: Tympanic membrane, ear canal and external ear normal. Tympanic membrane is not erythematous or bulging.     Left Ear: Tympanic membrane, ear canal and external ear normal. Tympanic membrane is not erythematous or bulging.     Ears:     Comments: No tenderness to palpation of bilateral tragus Eyes:     Conjunctiva/sclera: Conjunctivae normal.     Pupils: Pupils are equal, round, and  reactive to light.  Pulmonary:     Effort: Pulmonary effort is normal. No respiratory distress.  Neurological:     Mental Status: She is alert and oriented to person, place, and time.     UC Treatments / Results  Labs (all labs ordered are listed, but only abnormal results are displayed) Labs Reviewed - No data to display  EKG   Radiology No results found.  Procedures Procedures (including critical care time)  Medications Ordered in UC Medications - No data to display  Initial Impression / Assessment and Plan / UC Course  I have reviewed the triage vital signs and the nursing notes.  Pertinent labs & imaging results that were available during my care of the patient were reviewed by me and considered in my medical decision making (see chart for details).     Discussed history and exam most consistent with eustachian tube dysfunction.  Patient to start with corticosteroid nasal spray for symptomatic relief.  Rx of azelastine sent to pharmacy, patient can add onto corticosteroid nasal spray if symptoms not improving.  Return precautions given.  Patient expresses understanding and agrees to plan.  Final Clinical Impressions(s) / UC Diagnoses   Final diagnoses:  Acute dysfunction of right eustachian tube   ED Prescriptions    Medication Sig Dispense Auth. Provider   azelastine (ASTELIN) 0.1 % nasal spray Place 1 spray into both nostrils 2 (two) times daily. Use in each nostril as directed 30 mL Ok Edwards, PA-C     PDMP not reviewed this encounter.   Ok Edwards, PA-C 10/16/19 1109

## 2019-10-16 NOTE — Discharge Instructions (Signed)
Start flonase or nasacort over the counter as directed. Start one for 1-2 weeks. If symptoms not improving, switch to the other. If still not improving after another 1-2 weeks, can add azelastine as directed. If still not improving, follow up with ENT for further evaluation needed.

## 2019-10-16 NOTE — ED Triage Notes (Signed)
Pt presents to [facility] for assessment of right ear popping when she swallows.  C/o mild pain to the area.

## 2020-09-06 ENCOUNTER — Other Ambulatory Visit: Payer: Self-pay | Admitting: Internal Medicine

## 2020-09-06 DIAGNOSIS — E041 Nontoxic single thyroid nodule: Secondary | ICD-10-CM

## 2020-09-12 ENCOUNTER — Ambulatory Visit
Admission: RE | Admit: 2020-09-12 | Discharge: 2020-09-12 | Disposition: A | Discharge status: Home / Self Care | Payer: Managed Care, Other (non HMO) | Source: Ambulatory Visit | Attending: Internal Medicine | Admitting: Internal Medicine

## 2020-09-12 DIAGNOSIS — E041 Nontoxic single thyroid nodule: Secondary | ICD-10-CM

## 2020-09-14 ENCOUNTER — Other Ambulatory Visit: Payer: Self-pay | Admitting: Internal Medicine

## 2020-09-14 DIAGNOSIS — E041 Nontoxic single thyroid nodule: Secondary | ICD-10-CM

## 2020-09-21 ENCOUNTER — Other Ambulatory Visit (HOSPITAL_COMMUNITY)
Admission: RE | Admit: 2020-09-21 | Discharge: 2020-09-21 | Disposition: A | Discharge status: Home / Self Care | Payer: Managed Care, Other (non HMO) | Source: Ambulatory Visit | Attending: Internal Medicine | Admitting: Internal Medicine

## 2020-09-21 ENCOUNTER — Ambulatory Visit
Admission: RE | Admit: 2020-09-21 | Discharge: 2020-09-21 | Disposition: A | Discharge status: Home / Self Care | Payer: Managed Care, Other (non HMO) | Source: Ambulatory Visit | Attending: Internal Medicine | Admitting: Internal Medicine

## 2020-09-21 DIAGNOSIS — E041 Nontoxic single thyroid nodule: Secondary | ICD-10-CM | POA: Diagnosis present

## 2020-09-21 DIAGNOSIS — D440 Neoplasm of uncertain behavior of thyroid gland: Secondary | ICD-10-CM | POA: Insufficient documentation

## 2020-09-21 NOTE — Procedures (Signed)
PROCEDURE SUMMARY:  Using direct ultrasound guidance, 5 passes were made using 25 g needles into the nodule within the right lobe of the thyroid.   Ultrasound was used to confirm needle placements on all occasions.   EBL = trace  Specimens were sent to Pathology for analysis.  See procedure note under Imaging tab in Epic for full procedure details.  Vanetta Rule S Hyder Deman PA-C 09/21/2020 3:15 PM

## 2020-09-22 LAB — CYTOLOGY - NON PAP

## 2020-09-27 ENCOUNTER — Other Ambulatory Visit: Payer: Self-pay | Admitting: Internal Medicine

## 2020-09-27 DIAGNOSIS — E041 Nontoxic single thyroid nodule: Secondary | ICD-10-CM

## 2021-06-27 ENCOUNTER — Ambulatory Visit
Admission: RE | Admit: 2021-06-27 | Discharge: 2021-06-27 | Disposition: A | Discharge status: Home / Self Care | Payer: Managed Care, Other (non HMO) | Source: Ambulatory Visit

## 2021-06-27 ENCOUNTER — Other Ambulatory Visit: Payer: Self-pay

## 2021-06-27 VITALS — BP 136/86 | HR 99 | Temp 98.8°F | Resp 18

## 2021-06-27 DIAGNOSIS — J069 Acute upper respiratory infection, unspecified: Secondary | ICD-10-CM

## 2021-06-27 DIAGNOSIS — Z1152 Encounter for screening for COVID-19: Secondary | ICD-10-CM | POA: Diagnosis not present

## 2021-06-27 DIAGNOSIS — R059 Cough, unspecified: Secondary | ICD-10-CM

## 2021-06-27 MED ORDER — MUCINEX DM MAXIMUM STRENGTH 60-1200 MG PO TB12: 1.0000 | ORAL_TABLET | Freq: Two times a day (BID) | ORAL | 0 refills | Status: DC

## 2021-06-27 MED ORDER — PREDNISONE 20 MG PO TABS: 40.0000 mg | ORAL_TABLET | Freq: Every day | ORAL | 0 refills | Status: AC

## 2021-06-27 MED ORDER — PROMETHAZINE-DM 6.25-15 MG/5ML PO SYRP: 5.0000 mL | ORAL_SOLUTION | Freq: Four times a day (QID) | ORAL | 0 refills | Status: DC | PRN

## 2021-06-27 NOTE — ED Provider Notes (Signed)
[name]    CSN: [id number] Arrival date & time: 06/27/21  1207      History   Chief Complaint Chief Complaint  Patient presents with   appointment '@12p'$    Cough    HPI Leslie Ellison is a 50 y.o. female.   Subjective:   Leslie Ellison is a 50 y.o. female who presents for evaluation of symptoms of a URI. Symptoms include chills, hot and cold spells, chest congestion, nonproductive cough, headache, sore throat due to the coughing, and body aches. Onset of symptoms was 2 days ago and has been unchanged since that time. She denies any fever, nasal congestion, purulent nasal discharge, shortness of breath, sinus pressure, sneezing, nausea, vomiting or diarrhea. She has tried OTC cough/cold meds without much relief in her symptoms. She denies any known COVID exposure. She has been vaccinated against COVID. She had a negative home COVID test one day ago but would like another test. She is drinking plenty of fluids.   The following portions of the patient's history were reviewed and updated as appropriate: allergies, current medications, past family history, past medical history, past social history, past surgical history, and problem list.    Past Medical History:  Diagnosis Date   GERD (gastroesophageal reflux disease)     Patient Active Problem List   Diagnosis Date Noted   Cerumen impaction 01/26/2015   Allergic sinusitis 01/26/2015   Tremor 04/13/2014    Past Surgical History:  Procedure Laterality Date   None      OB History   No obstetric history on file.      Home Medications    Prior to Admission medications   Medication Sig Start Date End Date Taking? Authorizing Provider  Dextromethorphan-guaiFENesin (MUCINEX DM MAXIMUM STRENGTH) 60-1200 MG TB12 Take 1 tablet by mouth 2 (two) times daily. 06/27/21  Yes Enrique Sack, FNP  predniSONE (DELTASONE) 20 MG tablet Take 2 tablets (40 mg total) by mouth daily for 5 days. 06/27/21 07/02/21 Yes  Enrique Sack, FNP  promethazine-dextromethorphan (PROMETHAZINE-DM) 6.25-15 MG/5ML syrup Take 5 mLs by mouth 4 (four) times daily as needed for cough. 06/27/21  Yes Enrique Sack, FNP  albuterol (PROVENTIL HFA;VENTOLIN HFA) 108 (90 BASE) MCG/ACT inhaler Inhale 2 puffs into the lungs every 4 (four) hours as needed for wheezing or shortness of breath (cough, shortness of breath or wheezing.). 06/19/15   Shawnee Knapp, MD  azelastine (ASTELIN) 0.1 % nasal spray Place 1 spray into both nostrils 2 (two) times daily. Use in each nostril as directed 10/16/19   Tasia Catchings, Amy V, PA-C  ibuprofen (ADVIL,MOTRIN) 800 MG tablet Take 1 tablet (800 mg total) by mouth 3 (three) times daily. 10/22/16   Gloriann Loan, PA-C  norgestrel-ethinyl estradiol (CRYSELLE-28) 0.3-30 MG-MCG tablet Cryselle (28) 0.3 mg-30 mcg tablet    [provider]  cetirizine (ZYRTEC) 10 MG tablet Take 1 tablet (10 mg total) by mouth at bedtime. 06/21/15 10/16/19  Shawnee Knapp, MD  fluticasone (FLONASE) 50 MCG/ACT nasal spray Place 2 sprays into both nostrils daily. Patient not taking: Reported on 06/21/2015 01/26/15 10/16/19  Gerda Diss, DO  propranolol ER (INDERAL LA) 160 MG SR capsule Take 1 capsule (160 mg total) by mouth daily. Patient not taking: Reported on 06/19/2015 08/02/14 10/16/19  Drema Dallas, DO  ranitidine (ZANTAC) 150 MG capsule Take 1 capsule (150 mg total) by mouth 2 (two) times daily. 06/21/15 10/16/19  Shawnee Knapp, MD    Family History Family History  Problem Relation Age of Onset   Hypertension Mother    Hypertension Father    Diabetes Brother     Social History Social History   Tobacco Use   Smoking status: Never   Smokeless tobacco: Never  Substance Use Topics   Alcohol use: Yes    Comment: occasional   Drug use: No     Allergies   Patient has no known allergies.   Review of Systems Review of Systems  Constitutional:  Positive for chills. Negative for fever.  HENT:  Positive for  congestion.   Respiratory:  Positive for cough. Negative for shortness of breath and wheezing.   Gastrointestinal:  Negative for diarrhea, nausea and vomiting.  Musculoskeletal:  Positive for myalgias.  Neurological:  Positive for headaches.  All other systems reviewed and are negative.   Physical Exam Triage Vital Signs ED Triage Vitals  Enc Vitals Group     BP 06/27/21 1257 136/86     Pulse Rate 06/27/21 1257 99     Resp 06/27/21 1257 18     Temp 06/27/21 1257 98.8 F (37.1 C)     Temp Source 06/27/21 1257 Oral     SpO2 06/27/21 1257 98 %     Weight --      Height --      Head Circumference --      Peak Flow --      Pain Score 06/27/21 1258 0     Pain Loc --      Pain Edu? --      Excl. in [place]? --    No data found.  Updated Vital Signs BP 136/86 (BP Location: Left Arm)   Pulse 99   Temp 98.8 F (37.1 C) (Oral)   Resp 18   SpO2 98%   Visual Acuity Right Eye Distance:   Left Eye Distance:   Bilateral Distance:    Right Eye Near:   Left Eye Near:    Bilateral Near:     Physical Exam Vitals reviewed.  Constitutional:      General: She is not in acute distress.    Appearance: Normal appearance. She is not ill-appearing, toxic-appearing or diaphoretic.  HENT:     Head: Normocephalic.     Nose: Nose normal.     Mouth/Throat:     Mouth: Mucous membranes are moist.  Eyes:     Conjunctiva/sclera: Conjunctivae normal.     Pupils: Pupils are equal, round, and reactive to light.  Cardiovascular:     Rate and Rhythm: Normal rate.  Pulmonary:     Effort: Pulmonary effort is normal.  Musculoskeletal:        General: Normal range of motion.     Cervical back: Normal range of motion and neck supple.  Lymphadenopathy:     Cervical: No cervical adenopathy.  Skin:    General: Skin is warm and dry.  Neurological:     General: No focal deficit present.     Mental Status: She is alert and oriented to person, place, and time.  Psychiatric:        Mood and Affect:  Mood normal.        Behavior: Behavior normal.     UC Treatments / Results  Labs (all labs ordered are listed, but only abnormal results are displayed) Labs Reviewed  NOVEL CORONAVIRUS, NAA    EKG   Radiology No results found.  Procedures Procedures (including critical care time)  Medications Ordered in UC Medications - No data  to display  Initial Impression / Assessment and Plan / UC Course  I have reviewed the triage vital signs and the nursing notes.  Pertinent labs & imaging results that were available during my care of the patient were reviewed by me and considered in my medical decision making (see chart for details).   50 yo female presenting with chills, hot and cold spells, chest congestion, nonproductive cough, headache, sore throat due to the coughing, and body aches. Patient is afebrile.  Vital signs stable.   Plan:  COVID test pending  Discussed diagnosis and treatment of URI. Suggested symptomatic OTC remedies. Nasal saline spray for congestion. Follow up as needed.  Today's evaluation has revealed no signs of a dangerous process. Discussed diagnosis with patient and/or guardian. Patient and/or guardian aware of their diagnosis, possible red flag symptoms to watch out for and need for close follow up. Patient and/or guardian understands verbal and written discharge instructions. Patient and/or guardian comfortable with plan and disposition.  Patient and/or guardian has a clear mental status at this time, good insight into illness (after discussion and teaching) and has clear judgment to make decisions regarding their care  This care was provided during an unprecedented National Emergency due to the Novel Coronavirus (COVID-19) pandemic. COVID-19 infections and transmission risks place heavy strains on healthcare resources.  As this pandemic evolves, our facility, providers, and staff strive to respond fluidly, to remain operational, and to provide care relative  to available resources and information. Outcomes are unpredictable and treatments are without well-defined guidelines. Further, the impact of COVID-19 on all aspects of urgent care, including the impact to patients seeking care for reasons other than COVID-19, is unavoidable during this national emergency. At this time of the global pandemic, management of patients has significantly changed, even for non-COVID positive patients given high local and regional COVID volumes at this time requiring high healthcare system and resource utilization. The standard of care for management of both COVID suspected and non-COVID suspected patients continues to change rapidly at the local, regional, national, and global levels. This patient was worked up and treated to the best available but ever changing evidence and resources available at this current time.   Documentation was completed with the aid of voice recognition software. Transcription may contain typographical errors.   Final Clinical Impressions(s) / UC Diagnoses   Final diagnoses:  Cough  Viral upper respiratory tract infection  Encounter for screening for COVID-19     Discharge Instructions      Take medications as prescribed. You may take tylenol or ibuprofen as needed for fevers/headache/body aches. Drink plenty of fluids. Stay in home isolation until you receive results of your COVID test. You will only be notified for positive results. You may go online to MyChart in the next few days and review your results.      ED Prescriptions     Medication Sig Dispense Auth. Provider   predniSONE (DELTASONE) 20 MG tablet Take 2 tablets (40 mg total) by mouth daily for 5 days. 10 tablet Enrique Sack, FNP   promethazine-dextromethorphan (PROMETHAZINE-DM) 6.25-15 MG/5ML syrup Take 5 mLs by mouth 4 (four) times daily as needed for cough. 118 mL Shivonne Schwartzman, [place], FNP   Dextromethorphan-guaiFENesin (MUCINEX DM MAXIMUM STRENGTH) 60-1200 MG TB12 Take  1 tablet by mouth 2 (two) times daily. 20 tablet Enrique Sack, FNP      PDMP not reviewed this encounter.   Enrique Sack, [place] 06/27/21 1409

## 2021-06-27 NOTE — Discharge Instructions (Addendum)
Take medications as prescribed. You may take tylenol or ibuprofen as needed for fevers/headache/body aches. Drink plenty of fluids. Stay in home isolation until you receive results of your COVID test. You will only be notified for positive results. You may go online to MyChart in the next few days and review your results.

## 2021-06-27 NOTE — ED Triage Notes (Signed)
Onset yesterday morning of cough and congestion. Has been taking robitussin, delsym and Nyquil without relief. C/o scratchy throat. Negative rapid covid test. Pt would like to have another test. No n/v/d/r.

## 2021-06-28 LAB — NOVEL CORONAVIRUS, NAA: SARS-CoV-2, NAA: DETECTED — AB

## 2021-06-28 LAB — SARS-COV-2, NAA 2 DAY TAT

## 2021-07-02 ENCOUNTER — Ambulatory Visit: Payer: Self-pay

## 2021-08-15 ENCOUNTER — Other Ambulatory Visit (HOSPITAL_BASED_OUTPATIENT_CLINIC_OR_DEPARTMENT_OTHER): Payer: Self-pay

## 2021-08-15 DIAGNOSIS — R251 Tremor, unspecified: Secondary | ICD-10-CM

## 2021-08-15 DIAGNOSIS — G40309 Generalized idiopathic epilepsy and epileptic syndromes, not intractable, without status epilepticus: Secondary | ICD-10-CM

## 2021-09-03 ENCOUNTER — Other Ambulatory Visit: Payer: Self-pay

## 2021-09-03 ENCOUNTER — Ambulatory Visit
Admission: RE | Admit: 2021-09-03 | Discharge: 2021-09-03 | Disposition: A | Discharge status: Home / Self Care | Payer: Managed Care, Other (non HMO) | Source: Ambulatory Visit | Attending: Internal Medicine | Admitting: Internal Medicine

## 2021-09-03 DIAGNOSIS — E041 Nontoxic single thyroid nodule: Secondary | ICD-10-CM

## 2021-09-06 ENCOUNTER — Other Ambulatory Visit: Payer: Self-pay

## 2021-09-06 ENCOUNTER — Ambulatory Visit (HOSPITAL_BASED_OUTPATIENT_CLINIC_OR_DEPARTMENT_OTHER): Payer: Managed Care, Other (non HMO) | Admitting: Family Medicine

## 2021-09-06 ENCOUNTER — Encounter (HOSPITAL_BASED_OUTPATIENT_CLINIC_OR_DEPARTMENT_OTHER): Payer: Self-pay | Admitting: Family Medicine

## 2021-09-06 VITALS — BP 133/71 | HR 116 | Ht 65.0 in | Wt 183.6 lb

## 2021-09-06 DIAGNOSIS — R251 Tremor, unspecified: Secondary | ICD-10-CM | POA: Diagnosis not present

## 2021-09-06 DIAGNOSIS — Z23 Encounter for immunization: Secondary | ICD-10-CM

## 2021-09-06 NOTE — Assessment & Plan Note (Signed)
Has previously been evaluated by orthopedic surgery with recommendation for reevaluation with neurology Recommended that she proceed with further evaluation with neurology.  She has been referred back to neurologist office that she did see previously which will be beneficial from a continuity of care standpoint and allow them to review prior evaluation

## 2021-09-06 NOTE — Assessment & Plan Note (Signed)
Interested in flu vaccine, administered today Discussed risk and benefits and recommendations for annual flu vaccination

## 2021-09-06 NOTE — Progress Notes (Signed)
New Patient Office Visit  Subjective:  Patient ID: Leslie Ellison, female    DOB: [birth year]  Age: 50 y.o. MRN: [id number]  CC:  Chief Complaint  Patient presents with   Establish Care    Patient injured her right hand and wrist 6 years ago.  Her forearm, wrist and hand has tremors.  She reports it aches at times and certain movements or trying to pick up things will cause pain.     HPI Leslie Ellison is a 50 year old female presenting to establish clinic.  She reports current concerns as outlined above.  Reports past medical history of tremor.  Tremor: Initially had some issues following a fall onto her right hand/arm.  Had some slight improvement, but has noted worsening recently.  She initially saw neurology around the time of injury.  Initial evaluation at that time included EMG, MRI brain which were unremarkable.  More recently with her new issues she has seen orthopedic surgeon.  She reports evaluation with them has been inconclusive.  Unable to view these records on chart review.  She has been referred to neurology and will be reestablishing with them later this month on October 24.  Patient is interested in receiving flu vaccine today Patient does follow with OB/GYN, Dr. Ronita Hipps  Past Medical History:  Diagnosis Date   GERD (gastroesophageal reflux disease)     Past Surgical History:  Procedure Laterality Date   None      Family History  Problem Relation Age of Onset   Hypertension Mother    Hypertension Father    Diabetes Brother     Social History   Socioeconomic History   Marital status: Single    Spouse name: Not on file   Number of children: 2   Years of education: college   Highest education level: Not on file  Occupational History    Employer: [employer]  Tobacco Use   Smoking status: Never   Smokeless tobacco: Never  Vaping Use   Vaping Use: Some days   Substances: Flavoring  Substance and Sexual Activity   Alcohol use: Yes    Comment:  occasional   Drug use: No   Sexual activity: Not on file  Other Topics Concern   Not on file  Social History Narrative   Single, 2 children   Right handed   Associates degree   2-3 cups    Social Determinants of Health   Financial Resource Strain: Not on file  Food Insecurity: Not on file  Transportation Needs: Not on file  Physical Activity: Not on file  Stress: Not on file  Social Connections: Not on file  Intimate Partner Violence: Not on file    Objective:   Today's Vitals: BP 133/71   Pulse (!) 116   Ht 5\' 5"  (1.651 m)   Wt 183 lb 9.6 oz (83.3 kg)   SpO2 100%   BMI 30.55 kg/m   Physical Exam  50 year old female in no acute distress Cardiovascular exam with regular rate and rhythm, no murmurs appreciated Lungs clear to auscultation bilaterally  Assessment & Plan:   Problem List Items Addressed This Visit       Other   Tremor - Primary    Has previously been evaluated by orthopedic surgery with recommendation for reevaluation with neurology Recommended that she proceed with further evaluation with neurology.  She has been referred back to neurologist office that she did see previously which will be beneficial from a continuity of care standpoint  and allow them to review prior evaluation      Need for influenza vaccination    Interested in flu vaccine, administered today Discussed risk and benefits and recommendations for annual flu vaccination       Outpatient Encounter Medications as of 09/06/2021  Medication Sig   benzonatate (TESSALON) 100 MG capsule benzonatate 100 mg capsule  TAKE 2 CAPSULES (200 MG TOTAL) BY MOUTH 3 (THREE) TIMES A DAY AS NEEDED FOR COUGH FOR UP TO 21 DAYS.   albuterol (PROVENTIL HFA;VENTOLIN HFA) 108 (90 BASE) MCG/ACT inhaler Inhale 2 puffs into the lungs every 4 (four) hours as needed for wheezing or shortness of breath (cough, shortness of breath or wheezing.).   azelastine (ASTELIN) 0.1 % nasal spray Place 1 spray into both  nostrils 2 (two) times daily. Use in each nostril as directed   Dextromethorphan-guaiFENesin (MUCINEX DM MAXIMUM STRENGTH) 60-1200 MG TB12 Take 1 tablet by mouth 2 (two) times daily.   ibuprofen (ADVIL,MOTRIN) 800 MG tablet Take 1 tablet (800 mg total) by mouth 3 (three) times daily.   norgestrel-ethinyl estradiol (CRYSELLE-28) 0.3-30 MG-MCG tablet Cryselle (28) 0.3 mg-30 mcg tablet   [DISCONTINUED] cetirizine (ZYRTEC) 10 MG tablet Take 1 tablet (10 mg total) by mouth at bedtime.   [DISCONTINUED] fluticasone (FLONASE) 50 MCG/ACT nasal spray Place 2 sprays into both nostrils daily. (Patient not taking: Reported on 06/21/2015)   [DISCONTINUED] promethazine-dextromethorphan (PROMETHAZINE-DM) 6.25-15 MG/5ML syrup Take 5 mLs by mouth 4 (four) times daily as needed for cough.   [DISCONTINUED] propranolol ER (INDERAL LA) 160 MG SR capsule Take 1 capsule (160 mg total) by mouth daily. (Patient not taking: Reported on 06/19/2015)   [DISCONTINUED] ranitidine (ZANTAC) 150 MG capsule Take 1 capsule (150 mg total) by mouth 2 (two) times daily.   No facility-administered encounter medications on file as of 09/06/2021.    Follow-up: No follow-ups on file.  Plan for follow-up in about 3 to 4 months to complete CPE Patient to bring results of labs completed through work to office for [place] to review  Hope Holst J [place], MD

## 2021-09-19 ENCOUNTER — Encounter: Payer: Self-pay | Admitting: *Deleted

## 2021-09-24 ENCOUNTER — Ambulatory Visit: Payer: Managed Care, Other (non HMO) | Admitting: Diagnostic Neuroimaging

## 2021-09-24 ENCOUNTER — Encounter: Payer: Self-pay | Admitting: Diagnostic Neuroimaging

## 2021-09-24 ENCOUNTER — Other Ambulatory Visit: Payer: Self-pay

## 2021-09-26 ENCOUNTER — Encounter: Payer: Self-pay | Admitting: Diagnostic Neuroimaging

## 2021-09-26 ENCOUNTER — Ambulatory Visit: Payer: Managed Care, Other (non HMO) | Admitting: Diagnostic Neuroimaging

## 2021-09-26 VITALS — BP 149/88 | HR 80 | Ht 64.5 in | Wt 185.0 lb

## 2021-09-26 DIAGNOSIS — R251 Tremor, unspecified: Secondary | ICD-10-CM | POA: Diagnosis not present

## 2021-09-26 NOTE — Progress Notes (Signed)
[facility]  PATIENT: Leslie Ellison DOB: 06-07-1971  REFERRING CLINICIAN: [place], Raymond J, MD HISTORY FROM: patient  REASON FOR VISIT: new consult    HISTORICAL  CHIEF COMPLAINT:  Chief Complaint  Patient presents with   Tremors    Rm 6 New Pt "tremor in right hand, NCS done-no carpal tunnel found; tingles/swells/numbs"      HISTORY OF PRESENT ILLNESS:   50 year old female with history of right hand tremors in 2015, here for evaluation of recurrent right hand tremor.  In 2015 patient had onset of right hand tremor.  She saw neurology and had MRI of the brain and EMG testing which were unremarkable.  Patient was empirically tried on propranolol without relief.  Symptoms spontaneously improved by 2016.  She has had no recurrent symptoms until July 2022.  No specific triggering or aggravating factors.  Patient noted right hand and wrist swelling, redness and pain.  She went to orthopedic clinic and was tried on prednisone which helped reduce pain and swelling.  However patient then developed recurrence of right hand tremor similar to 2015.  She has had EMG testing which were unremarkable.  Patient has some discomfort and pain in her right upper extremity.  She has some neck tension.  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   REVIEW OF SYSTEMS: Full 14 system review of systems performed and negative with exception of: as per HPI.  ALLERGIES: No Known Allergies  HOME MEDICATIONS: Outpatient Medications Prior to Visit  Medication Sig Dispense Refill   norgestrel-ethinyl estradiol (CRYSELLE-28) 0.3-30 MG-MCG tablet Cryselle (28) 0.3 mg-30 mcg tablet     azelastine (ASTELIN) 0.1 % nasal spray Place 1 spray into both nostrils 2 (two) times daily. Use in each nostril as directed 30 mL 0   benzonatate (TESSALON) 100 MG capsule benzonatate 100 mg capsule  TAKE 2 CAPSULES (200 MG TOTAL) BY MOUTH 3  (THREE) TIMES A DAY AS NEEDED FOR COUGH FOR UP TO 21 DAYS.     Dextromethorphan-guaiFENesin (MUCINEX DM MAXIMUM STRENGTH) 60-1200 MG TB12 Take 1 tablet by mouth 2 (two) times daily. 20 tablet 0   ibuprofen (ADVIL,MOTRIN) 800 MG tablet Take 1 tablet (800 mg total) by mouth 3 (three) times daily. 21 tablet 0   No facility-administered medications prior to visit.    PAST MEDICAL HISTORY: Past Medical History:  Diagnosis Date   Carpal tunnel syndrome on right    GERD (gastroesophageal reflux disease)    Tremor of right hand    Trigger thumb of right hand     PAST SURGICAL HISTORY: Past Surgical History:  Procedure Laterality Date   None      FAMILY HISTORY: Family History  Problem Relation Age of Onset   Hypertension Mother    Hypertension Father    Diabetes Brother     SOCIAL HISTORY: Social History   Socioeconomic History   Marital status: Single    Spouse name: Not on file   Number of children: 2   Years of education: college   Highest education level: Associate degree: occupational, Hotel manager, or vocational program  Occupational History    Employer: [employer]    Comment: phlebotomist  Tobacco Use   Smoking status: Never   Smokeless tobacco: Never  Vaping Use   Vaping Use: Some days   Substances: Flavoring  Substance and Sexual Activity   Alcohol use: Yes    Comment: occasional   Drug use:  No   Sexual activity: Not on file  Other Topics Concern   Not on file  Social History Narrative   Single, 2 children   Right handed   Associates degree   Caffeine 2-3 cups    Social Determinants of Health   Financial Resource Strain: Not on file  Food Insecurity: Not on file  Transportation Needs: Not on file  Physical Activity: Not on file  Stress: Not on file  Social Connections: Not on file  Intimate Partner Violence: Not on file     PHYSICAL EXAM  GENERAL EXAM/CONSTITUTIONAL: Vitals:  Vitals:   09/26/21 0937  BP: (!) 149/88  Pulse: 80  Weight: 185  lb (83.9 kg)  Height: 5' 4.5" (1.638 m)   Body mass index is 31.26 kg/m. Wt Readings from Last 3 Encounters:  09/26/21 185 lb (83.9 kg)  09/06/21 183 lb 9.6 oz (83.3 kg)  07/01/18 170 lb 6.4 oz (77.3 kg)   Patient is in no distress; well developed, nourished and groomed; neck is supple  CARDIOVASCULAR: Examination of carotid arteries is normal; no carotid bruits Regular rate and rhythm, no murmurs Examination of peripheral vascular system by observation and palpation is normal  EYES: Ophthalmoscopic exam of optic discs and posterior segments is normal; no papilledema or hemorrhages No results found.  MUSCULOSKELETAL: Gait, strength, tone, movements noted in Neurologic exam below  NEUROLOGIC: MENTAL STATUS:  No flowsheet data found. awake, alert, oriented to person, place and time recent and remote memory intact normal attention and concentration language fluent, comprehension intact, naming intact fund of knowledge appropriate  CRANIAL NERVE:  2nd - no papilledema on fundoscopic exam 2nd, 3rd, 4th, 6th - pupils equal and reactive to light, visual fields full to confrontation, extraocular muscles intact, no nystagmus 5th - facial sensation symmetric 7th - facial strength symmetric 8th - hearing intact 9th - palate elevates symmetrically, uvula midline 11th - shoulder shrug symmetric 12th - tongue protrusion midline  MOTOR:  normal bulk and tone, full strength in the BUE, BLE WAXING AND WANING TREMOR IN RIGHT HAND, POSTURAL AND ACTION NO BRADYKINESIA, RIGIDITY OR REST TREMOR  SENSORY:  normal and symmetric to light touch, temperature, vibration  COORDINATION:  finger-nose-finger, fine finger movements normal  REFLEXES:  deep tendon reflexes present and symmetric  GAIT/STATION:  narrow based gait; NORMAL ARM SWING     DIAGNOSTIC DATA (LABS, IMAGING, TESTING) - I reviewed patient records, labs, notes, testing and imaging myself where available.  Lab  Results  Component Value Date   WBC 10.7 (H) 06/07/2012   HGB 12.2 06/07/2012   HCT 36.0 06/07/2012   MCV 83.7 06/07/2012   PLT 288 06/07/2012      Component Value Date/Time   NA 141 08/03/2014 0819   K 4.1 08/03/2014 0819   CL 103 08/03/2014 0819   CO2 21 08/03/2014 0819   GLUCOSE 99 08/03/2014 0819   GLUCOSE 98 06/07/2012 0204   BUN 11 08/03/2014 0819   CREATININE 1.08 (H) 08/03/2014 0819   CALCIUM 9.0 08/03/2014 0819   PROT 6.6 08/03/2014 0819   ALBUMIN 4.1 08/03/2014 0819   AST 12 08/03/2014 0819   ALT 11 08/03/2014 0819   ALKPHOS 38 (L) 08/03/2014 0819   BILITOT 0.6 08/03/2014 0819   GFRNONAA 63 08/03/2014 0819   GFRAA 73 08/03/2014 0819   No results found for: CHOL, HDL, LDLCALC, LDLDIRECT, TRIG, CHOLHDL No results found for: HGBA1C No results found for: VITAMINB12 Lab Results  Component Value Date   TSH 0.969  08/03/2014    04/26/14 EMG/NCS - Normal study. No evidence of large fiber neuropathy or myopathy at this time.  05/18/14 MRI brain Equivocal MRI brain (with and without) demonstrating of few punctate foci of non specific gliosis. No abnormal enhancing lesions. No acute findings.     ASSESSMENT AND PLAN  50 y.o. year old female here with intermittent right hand postural and action tremor, first noted in 2015 with spontaneous resolution by 2016, now returned in 2022.  Previous work-up was negative.  Unclear etiology.  Will repeat work-up.  Dx:  1. Tremor of right hand     PLAN:  RIGHT HAND TREMOR (postural / action; intermittent; previously in 2015, then resolved; now returned in 2022) - check MRI brain, cervical spine - consider occupational therapy - consider primidone (although essential tremor very unlikely given time course and relapse/remit course)  Orders Placed This Encounter  Procedures   MR [place]   Return for pending if symptoms worsen or fail to improve, pending test  results.    Penni Bombard, MD 86/38/1771, 16:57 AM Certified in Neurology, Neurophysiology and Neuroimaging  [facility] [address] [place], [place] [address] [phone]

## 2021-09-26 NOTE — Patient Instructions (Signed)
  RIGHT HAND TREMOR (postural / action; intermittent; previously in 2015, then resolved; now returned in 2022) - check MRI brain, cervical spine - consider occupational therapy - consider primidone

## 2021-10-05 ENCOUNTER — Ambulatory Visit: Payer: Managed Care, Other (non HMO) | Admitting: Diagnostic Neuroimaging

## 2021-10-16 ENCOUNTER — Ambulatory Visit
Admission: RE | Admit: 2021-10-16 | Discharge: 2021-10-16 | Disposition: A | Discharge status: Home / Self Care | Payer: Managed Care, Other (non HMO) | Source: Ambulatory Visit | Attending: Diagnostic Neuroimaging | Admitting: Diagnostic Neuroimaging

## 2021-10-16 ENCOUNTER — Other Ambulatory Visit: Payer: Self-pay

## 2021-10-16 DIAGNOSIS — R251 Tremor, unspecified: Secondary | ICD-10-CM

## 2021-10-17 ENCOUNTER — Other Ambulatory Visit: Payer: Self-pay | Admitting: *Deleted

## 2021-10-17 ENCOUNTER — Telehealth: Payer: Self-pay | Admitting: Diagnostic Neuroimaging

## 2021-10-17 DIAGNOSIS — R251 Tremor, unspecified: Secondary | ICD-10-CM

## 2021-10-17 NOTE — Telephone Encounter (Signed)
Noted, thank you

## 2021-10-17 NOTE — Telephone Encounter (Signed)
Pt LVM, appt was setup for me to go the GI to have a MRI done. Calling to see if I can get that rescheduled. Did go to the appt, but did not work out too well.GI said would have to reschedule MRI with [facility]. If you could give me a call back to go over more details on what happened.

## 2021-10-17 NOTE — Telephone Encounter (Signed)
MRI brain, cervical spine orders placed.

## 2021-10-17 NOTE — Telephone Encounter (Signed)
Please put new MRI orders in thank you.

## 2021-11-07 ENCOUNTER — Other Ambulatory Visit: Payer: Self-pay

## 2021-11-07 ENCOUNTER — Ambulatory Visit
Admission: RE | Admit: 2021-11-07 | Discharge: 2021-11-07 | Disposition: A | Discharge status: Home / Self Care | Payer: Managed Care, Other (non HMO) | Source: Ambulatory Visit | Attending: Diagnostic Neuroimaging | Admitting: Diagnostic Neuroimaging

## 2021-11-07 DIAGNOSIS — R251 Tremor, unspecified: Secondary | ICD-10-CM

## 2021-11-07 MED ORDER — GADOBENATE DIMEGLUMINE 529 MG/ML IV SOLN
17.0000 mL | Freq: Once | INTRAVENOUS | Status: AC | PRN
  Administered 2021-11-07: 17 mL via INTRAVENOUS

## 2021-11-12 ENCOUNTER — Telehealth: Payer: Self-pay

## 2021-11-12 DIAGNOSIS — R251 Tremor, unspecified: Secondary | ICD-10-CM

## 2021-11-12 NOTE — Telephone Encounter (Signed)
Contacted pt regarding results, LVM rq call back

## 2021-11-12 NOTE — Telephone Encounter (Signed)
-----   Message from Penni Bombard, MD sent at 11/12/2021  3:39 PM EST ----- Unremarkable imaging results. Please call patient. Continue current plan. -VRP

## 2021-11-12 NOTE — Addendum Note (Signed)
Addended by: Verlin Grills on: 11/12/2021 04:28 PM   Modules accepted: Orders

## 2021-11-12 NOTE — Telephone Encounter (Addendum)
MRI of the Brain Leslie Ellison, Leslie Polka, MD  P Gna-Pod 3 Results Unremarkable imaging results. Please call patient. Continue current plan. -VRP Leslie Ellison, Leslie Polka, MD    MRI of Cervial Spine Leslie Ellison, Vikram R, MD  P Gna-Pod 3 Results Mild degenerative changes. Continue conservative mgmt.  -VRP    Pt called back and we discussed results. She verbalized understanding and sts right hand tremor continues to be an issue. Pt would like to pursue occupational therapy to see if improvement in her symptoms can be seen. I have placed the order for occupational therapy, pt will CB if she has not heard about referral in 1 week.

## 2021-11-20 ENCOUNTER — Ambulatory Visit: Payer: Managed Care, Other (non HMO) | Attending: Family Medicine | Admitting: Occupational Therapy

## 2021-11-20 ENCOUNTER — Encounter: Payer: Self-pay | Admitting: Occupational Therapy

## 2021-11-20 ENCOUNTER — Other Ambulatory Visit: Payer: Self-pay

## 2021-11-20 DIAGNOSIS — R208 Other disturbances of skin sensation: Secondary | ICD-10-CM | POA: Diagnosis present

## 2021-11-20 DIAGNOSIS — M6281 Muscle weakness (generalized): Secondary | ICD-10-CM | POA: Diagnosis present

## 2021-11-20 DIAGNOSIS — R251 Tremor, unspecified: Secondary | ICD-10-CM | POA: Insufficient documentation

## 2021-11-20 DIAGNOSIS — R278 Other lack of coordination: Secondary | ICD-10-CM | POA: Insufficient documentation

## 2021-11-20 NOTE — Therapy (Signed)
[facility] [address] [place], [place], [address] Phone: [phone]   Fax:  [phone]  Occupational Therapy Evaluation  Patient Details  Name: Leslie Ellison MRN: [id number] Date of Birth: February 26, 1971 Referring Provider (OT): Dr. Andrey Spearman   Encounter Date: 11/20/2021   OT End of Session - 11/20/21 1315     Visit Number 1    Number of Visits 13    Date for OT Re-Evaluation 01/04/22    Authorization Type per pt [employer]--awaiting insurance verification    OT Start Time 1108    OT Stop Time 1147    OT Time Calculation (min) 39 min    Activity Tolerance Patient tolerated treatment well    Behavior During Therapy WFL for tasks assessed/performed             Past Medical History:  Diagnosis Date   Carpal tunnel syndrome on right    GERD (gastroesophageal reflux disease)    Tremor of right hand    Trigger thumb of right hand     Past Surgical History:  Procedure Laterality Date   None      There were no vitals filed for this visit.   Subjective Assessment - 11/20/21 1110     Subjective  Pt reports that she woke up in July with pain, swelling, and stiffness in R hand.  Pt reports that she began having tremors in after starting Prednisone.  Pt reports hx of tremors after hand sprain s/p fall in 2015/2016, but it eventually resolved without intervention and unknown etilogy.    Pertinent History R hand tremor            PMH: trigger thumb R hand, ?CTS R hand, hx of resolved R hand in years ago, GERD    Special Tests pt reprots EMG and MRI were unrearkable    Patient Stated Goals improve R hand use    Currently in Pain? No/denies               [facility] OT Assessment - 11/20/21 0001       Assessment   Medical Diagnosis R hand tremor    Referring Provider (OT) Dr. 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, now doing administration work (no blood draws due to tremors since July)   Vocation Requirements typing, drug screens, occasionally writing    Leisure go out with friends, eat out, shopping      ADL   Eating/Feeding Modified independent   using RUE approx 50% of time and then LUE, more effort for cutting   Grooming Modified independent   difficulty with make-up   Upper Body Bathing Modified independent    Lower Body Bathing Modified independent    Upper Body Dressing --   difficulty putting on jewerly   Lower Body Dressing Modified independent    Toilet Transfer Independent    Toileting - Clothing Manipulation  Independent    Toileting -  Product/process development scientist Independent    ADL comments Pt reports difficulty using mouse, writing, opening  bottles/conntainers, picking up glass.  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    Prior Level of Function Meal [employer] doesn't do a lot of cooking, pt reports just incr time for cutting, difficulty picking up glass, needs assist for lifting large pot of water      Mobility   Mobility Status Independent      Sensation   Additional Comments Pt reports intermittent tingling and numbness, more in digits 4-5, more in evening; may be position related      Coordination   9 Hole Peg Test Right;Left    Right 9 Hole Peg Test 20.40    Left 9 Hole Peg Test 20.15    Tremors R hand tremors at rest and with action      ROM / Strength   AROM / PROM / Strength AROM      AROM   Overall AROM  Within functional limits for tasks  performed   RUE and hand     Hand Function   Right Hand Grip (lbs) 47.3    Right Hand Lateral Pinch 12 lbs    Right Hand 3 Point Pinch 12 lbs   tip-7lbs   Left Hand Grip (lbs) 65.2    Left Hand Lateral Pinch 16 lbs    Left 3 point pinch 12 lbs   tip-12lbs                             OT Education - 11/20/21 1313     Education Details OT eval resuts and POC, Pt instructed to avoid sustained elbow flex and putting pressure on elbow/leaning on elbow as it may be contributing to parasthesias in R hand.    Person(s) Educated Patient    Methods Explanation    Comprehension Verbalized understanding              OT Short Term Goals - 11/20/21 1334       OT SHORT TERM GOAL #1   Title Pt will be independent with initial HEP for coordination.--check STGs 12/20/20    Time 4    Period Weeks    Status New      OT SHORT TERM GOAL #2   Title Pt will verbalize understanding of AE/strategies for ADLs/IADLs (for tremors and numbness/tingling).    Time 4    Period Weeks    Status New               OT Long Term Goals - 11/20/21 1335       OT LONG TERM GOAL #1   Title Pt will be independent with HEP RUE strength/wt bearing.--check LTGs 01/04/21    Time 6    Period Weeks    Status New      OT LONG TERM GOAL #2   Title Pt will improve R grip strength by at least 8lbs to help with gripping/carrying tasks and opening containers.    Baseline 47.3lbs    Time 6    Period Weeks    Status New      OT LONG TERM GOAL #3   Title Pt will improve lateral and tip pinches by at least 2lbs each for ADLs/IADLs.  Baseline lateral 12lbs, tip 7lbs    Time 6    Period Weeks    Status New      OT LONG TERM GOAL #4   Title Pt will verbalize understanding of positioning and HEP to decr numbness/tingling.    Time 6    Period Weeks    Status New                   Plan - 11/20/21 1317     Clinical Impression Statement Pt is a 50 y.o. female referred to  occupational therapy for R hand tremor.  Pt reports that she woke up in July with stiffness and swelling R hand, went to Ortho and was put on prednisone.  Pt reports that tremor began after starting prednisone.  However, pt also reports tremors in 2015/2016 and was evaluated by neurology, but that it resolved without intervention.  Pt with PMH that includes :trigger thumb R hand, ?CTS R hand, GERD.  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  Pt currently performing administrative tasks.  Pt presents today with tremor, decr coodination, parasthesias, decr strength, and decr R dominant hand functional use.  Pt would benefit from occupational therapy to address these deficits for improved ADL/IADL ease/safety and improved dominant RUE functional use.    OT Occupational Profile and History Detailed Assessment- Review of Records and additional review of physical, cognitive, psychosocial history related to current functional performance    Occupational performance deficits (Please refer to evaluation for details): ADL's;IADL's;Work    Body Structure / Function / Physical Skills ADL;Decreased knowledge of use of DME;Strength;Dexterity;UE functional use;IADL    Rehab Potential Good    Clinical Decision Making Limited treatment options, no task modification necessary    Comorbidities Affecting Occupational Performance: None    Modification or Assistance to Complete Evaluation  No modification of tasks or assist necessary to complete eval    OT Frequency 2x / week    OT Duration 6 weeks   +eval   OT Treatment/Interventions Self-care/ADL training;Fluidtherapy;Moist Heat;DME and/or AE instruction;Splinting;Therapeutic activities;Therapeutic exercise;Neuromuscular education;Cryotherapy;Paraffin;Manual Therapy;Patient/family education;Passive range of motion    Plan Ulnar nerve glides, coordination HEP, strategies for writing (?built-up grip)    Consulted and Agree with Plan of  Care Patient             Patient will benefit from skilled therapeutic intervention in order to improve the following deficits and impairments:   Body Structure / Function / Physical Skills: ADL, Decreased knowledge of use of DME, Strength, Dexterity, UE functional use, IADL       Visit Diagnosis: Other lack of coordination  Tremor  Muscle weakness (generalized)  Other disturbances of skin sensation    Problem List Patient Active Problem List   Diagnosis Date Noted   Need for influenza vaccination 09/06/2021   Cerumen impaction 01/26/2015   Allergic sinusitis 01/26/2015   Tremor 04/13/2014    Tiran Sauseda, OT 11/20/2021, 1:40 PM  [facility] [address] [place], [place], [address] Phone: [phone]   Fax:  [phone]  Name: Leslie Ellison MRN: [id number] Date of Birth: 19-Jan-1971   Vianne Bulls, OTR/L [facility] [address] [place], [place]  [address] [phone] phone [phone] 11/20/21 1:40 PM

## 2021-11-27 ENCOUNTER — Other Ambulatory Visit: Payer: Self-pay

## 2021-11-27 ENCOUNTER — Ambulatory Visit: Payer: Managed Care, Other (non HMO) | Admitting: Occupational Therapy

## 2021-11-27 DIAGNOSIS — R208 Other disturbances of skin sensation: Secondary | ICD-10-CM

## 2021-11-27 DIAGNOSIS — M6281 Muscle weakness (generalized): Secondary | ICD-10-CM

## 2021-11-27 DIAGNOSIS — R278 Other lack of coordination: Secondary | ICD-10-CM | POA: Diagnosis not present

## 2021-11-27 DIAGNOSIS — R251 Tremor, unspecified: Secondary | ICD-10-CM

## 2021-11-27 NOTE — Patient Instructions (Addendum)
Coordination Exercises  Perform the following exercises for 15 minutes  1-2 times per day. Perform with right hand(s).   Flipping Cards: Place deck of cards on the table. Flip cards over by opening your hand big to grasp and then turn your palm up Deal cards: Hold 1/2 or whole deck in your hand. Use thumb to push card off top of deck with one big push. Rotate ball with fingertips: Pick up with fingers/thumb (clockwise and counter-clockwise). Rotate 2 golf balls in your hand: Both directions. Pick up coins and stack one at a time: Open hand then Pick up with big, intentional movements. Do not drag coin to the edge. (5-10 in a stack) Pick up 5-10 coins one at a time and hold in palm. Then, move coins from palm to fingertips one at time and place in coin bank/container. Practice writing: Slow down, write big, and focus on forming each letter.  Perform hand stretches frequently: Close hands then flick out your fingers with focus on opening hands, pulling wrists back. Put weight/lean through hand on table, on top of your leg, against wall with elbow straight.      **Avoid repetitive or sustained elbow flexion (elbow bent).  Also avoid leaning on elbow.

## 2021-11-27 NOTE — Therapy (Signed)
[facility] [address] [place], [place], [address] Phone: [phone]   Fax:  [phone]  Occupational Therapy Treatment  Patient Details  Name: Leslie Ellison MRN: [id number] Date of Birth: Feb 26, 1971 Referring Provider (OT): Dr. Andrey Spearman   Encounter Date: 11/27/2021   OT End of Session - 11/27/21 0731     Visit Number 2    Number of Visits 13    Date for OT Re-Evaluation 01/04/22    Authorization Type per pt [employer]--awaiting insurance verification    OT Start Time 0721    OT Stop Time 0803    OT Time Calculation (min) 42 min    Activity Tolerance Patient tolerated treatment well    Behavior During Therapy [facility] for tasks assessed/performed             Past Medical History:  Diagnosis Date   Carpal tunnel syndrome on right    GERD (gastroesophageal reflux disease)    Tremor of right hand    Trigger thumb of right hand     Past Surgical History:  Procedure Laterality Date   None      There were no vitals filed for this visit.   Subjective Assessment - 11/27/21 0728     Subjective  Pt reports that she noticed tremors more at holiday party when reaching for napkin--due to excitement?    Pertinent History R hand tremor            PMH: trigger thumb R hand, ?CTS R hand, hx of tremor resolved R hand in years ago, GERD    Special Tests pt reprots EMG and MRI were unrearkable    Patient Stated Goals improve R hand use    Currently in Pain? No/denies                       OT Education - 11/27/21 0734     Education Details Initial HEP (includingwt. bearing through hand/stretching hand) and Ulnar Nerve glides x10, instructed in use/practiced writing with foam grip and issued for home/work--see pt instructions.  Avoid sustained elbow flexion, putting pressure on elbow/leaning on elbow, and repetitive elbow flex as may be contributing to parasthesias in 4-5th digits.  Pt instructed to  avoid tensing/hiking shoulder and using too much pressure with finger/thumb flex as may be causing thumb soreness.    Person(s) Educated Patient    Methods Explanation;Demonstration;Verbal cues;Handout    Comprehension Verbalized understanding;Returned demonstration;Verbal cues required              OT Short Term Goals - 11/20/21 1334       OT SHORT TERM GOAL #1   Title Pt will be independent with initial HEP for coordination.--check STGs 12/20/20    Time 4    Period Weeks    Status New      OT SHORT TERM GOAL #2   Title Pt will verbalize understanding of AE/strategies for ADLs/IADLs (for tremors and numbness/tingling).    Time 4    Period Weeks    Status New               OT Long Term Goals - 11/20/21 1335       OT LONG TERM GOAL #1   Title Pt will be independent with HEP RUE strength/wt bearing.--check LTGs 01/04/21    Time 6    Period Weeks    Status New      OT LONG TERM GOAL #2  Title Pt will improve R grip strength by at least 8lbs to help with gripping/carrying tasks and opening containers.    Baseline 47.3lbs    Time 6    Period Weeks    Status New      OT LONG TERM GOAL #3   Title Pt will improve lateral and tip pinches by at least 2lbs each for ADLs/IADLs.    Baseline lateral 12lbs, tip 7lbs    Time 6    Period Weeks    Status New      OT LONG TERM GOAL #4   Title Pt will verbalize understanding of positioning and HEP to decr numbness/tingling.    Time 6    Period Weeks    Status New                   Plan - 11/27/21 0731     Clinical Impression Statement Pt able to return demo initial HEP.  Pt demo tremors inconsistently with most difficulty with stacking coins.  Tremors notes at rest and action at times.    OT Occupational Profile and History Comprehensive Assessment- Review of records and extensive additional review of physical, cognitive, psychosocial history related to current functional performance    Occupational performance  deficits (Please refer to evaluation for details): ADL's;IADL's;Work    Body Structure / Function / Physical Skills ADL;Decreased knowledge of use of DME;Strength;Dexterity;UE functional use;IADL    Rehab Potential Good    Clinical Decision Making Limited treatment options, no task modification necessary    Comorbidities Affecting Occupational Performance: None    Modification or Assistance to Complete Evaluation  No modification of tasks or assist necessary to complete eval    OT Frequency 2x / week    OT Duration 6 weeks   +eval   OT Treatment/Interventions Self-care/ADL training;Fluidtherapy;Moist Heat;DME and/or AE instruction;Splinting;Therapeutic activities;Therapeutic exercise;Neuromuscular education;Cryotherapy;Paraffin;Manual Therapy;Patient/family education;Passive range of motion    Plan review HEP, wt. bearing    Consulted and Agree with Plan of Care Patient             Patient will benefit from skilled therapeutic intervention in order to improve the following deficits and impairments:   Body Structure / Function / Physical Skills: ADL, Decreased knowledge of use of DME, Strength, Dexterity, UE functional use, IADL       Visit Diagnosis: Tremor  Other lack of coordination  Muscle weakness (generalized)  Other disturbances of skin sensation    Problem List Patient Active Problem List   Diagnosis Date Noted   Need for influenza vaccination 09/06/2021   Cerumen impaction 01/26/2015   Allergic sinusitis 01/26/2015   Tremor 04/13/2014    Karl Knarr, OT 11/27/2021, 8:27 AM  [facility] [address] [place], [place], [address] Phone: [phone]   Fax:  [phone]  Name: Leslie Ellison MRN: [id number] Date of Birth: 05-06-1971  Vianne Bulls, OTR/L [facility] [address] [place],   [address] [phone] phone [phone] 11/27/21 8:27  AM

## 2021-11-29 ENCOUNTER — Encounter: Payer: Managed Care, Other (non HMO) | Admitting: Occupational Therapy

## 2021-12-04 ENCOUNTER — Other Ambulatory Visit: Payer: Self-pay

## 2021-12-04 ENCOUNTER — Ambulatory Visit: Payer: Managed Care, Other (non HMO) | Attending: Family Medicine | Admitting: Occupational Therapy

## 2021-12-04 ENCOUNTER — Encounter: Payer: Self-pay | Admitting: Occupational Therapy

## 2021-12-04 DIAGNOSIS — M6281 Muscle weakness (generalized): Secondary | ICD-10-CM | POA: Diagnosis present

## 2021-12-04 DIAGNOSIS — R251 Tremor, unspecified: Secondary | ICD-10-CM | POA: Diagnosis present

## 2021-12-04 DIAGNOSIS — R278 Other lack of coordination: Secondary | ICD-10-CM | POA: Diagnosis present

## 2021-12-04 DIAGNOSIS — R208 Other disturbances of skin sensation: Secondary | ICD-10-CM | POA: Diagnosis present

## 2021-12-04 NOTE — Therapy (Signed)
[facility] [address] [place], [place], [address] Phone: [phone]   Fax:  [phone]  Occupational Therapy Treatment  Patient Details  Name: Leslie Ellison MRN: [id number] Date of Birth: August 27, 1971 Referring Provider (OT): Dr. Andrey Spearman   Encounter Date: 12/04/2021   OT End of Session - 12/04/21 0808     Visit Number 3    Number of Visits 13    Date for OT Re-Evaluation 01/04/22    Authorization Type per pt [employer]--awaiting insurance verification    OT Start Time 0805    OT Stop Time 0845    OT Time Calculation (min) 40 min    Activity Tolerance Patient tolerated treatment well    Behavior During Therapy [facility] for tasks assessed/performed             Past Medical History:  Diagnosis Date   Carpal tunnel syndrome on right    GERD (gastroesophageal reflux disease)    Tremor of right hand    Trigger thumb of right hand     Past Surgical History:  Procedure Laterality Date   None      There were no vitals filed for this visit.   Subjective Assessment - 12/04/21 0806     Subjective  feeling good    Pertinent History R hand tremor            PMH: trigger thumb R hand, ?CTS R hand, hx of tremor resolved R hand in years ago, GERD    Special Tests pt reprots EMG and MRI were unrearkable    Patient Stated Goals improve R hand use    Currently in Pain? No/denies              Wt. Bearing in prone on elbows (modified plank) for incr scapular stability, but pt reports mild tingling after 10 reps.    Reviewed ulnar nerve glides and pt returned demo with min cueing for proper position.  Picking up blocks using gripper on level 2 black spring for sustained grip strength with min cueing for compensation patterns  Reviewed from coordination HEP:  flipping cards and dealing cards with thumb for incr coordination     OT Education - 12/04/21 0810     Education Details Scapular stability  HEP--see pt instructions.  Pt shown/educated in accessibility features and adjustments for keyboard and mouse for use at work.    Person(s) Educated Patient    Methods Explanation;Demonstration;Verbal cues;Handout    Comprehension Verbalized understanding;Returned demonstration              OT Short Term Goals - 12/04/21 0841       OT SHORT TERM GOAL #1   Title Pt will be independent with initial HEP for coordination.--check STGs 12/20/20    Time 4    Period Weeks    Status New      OT SHORT TERM GOAL #2   Title Pt will verbalize understanding of AE/strategies for ADLs/IADLs (for tremors and numbness/tingling).    Time 4    Period Weeks    Status On-going   12/04/21: educated in foam grip for pen, computer accessibility features              OT Long Term Goals - 11/20/21 1335       OT LONG TERM GOAL #1   Title Pt will be independent with HEP RUE strength/wt bearing.--check LTGs 01/04/21    Time 6    Period Weeks  Status New      OT LONG TERM GOAL #2   Title Pt will improve R grip strength by at least 8lbs to help with gripping/carrying tasks and opening containers.    Baseline 47.3lbs    Time 6    Period Weeks    Status New      OT LONG TERM GOAL #3   Title Pt will improve lateral and tip pinches by at least 2lbs each for ADLs/IADLs.    Baseline lateral 12lbs, tip 7lbs    Time 6    Period Weeks    Status New      OT LONG TERM GOAL #4   Title Pt will verbalize understanding of positioning and HEP to decr numbness/tingling.    Time 6    Period Weeks    Status New                   Plan - 12/04/21 3716     Clinical Impression Statement Pt performs ulnar nerve glides and coordination activities with min cueing today.    OT Occupational Profile and History Comprehensive Assessment- Review of records and extensive additional review of physical, cognitive, psychosocial history related to current functional performance    Occupational performance  deficits (Please refer to evaluation for details): ADL's;IADL's;Work    Body Structure / Function / Physical Skills ADL;Decreased knowledge of use of DME;Strength;Dexterity;UE functional use;IADL    Rehab Potential Good    Clinical Decision Making Limited treatment options, no task modification necessary    Comorbidities Affecting Occupational Performance: None    Modification or Assistance to Complete Evaluation  No modification of tasks or assist necessary to complete eval    OT Frequency 2x / week    OT Duration 6 weeks   +eval   OT Treatment/Interventions Self-care/ADL training;Fluidtherapy;Moist Heat;DME and/or AE instruction;Splinting;Therapeutic activities;Therapeutic exercise;Neuromuscular education;Cryotherapy;Paraffin;Manual Therapy;Patient/family education;Passive range of motion    Plan reivew scapular stability HEP    Consulted and Agree with Plan of Care Patient             Patient will benefit from skilled therapeutic intervention in order to improve the following deficits and impairments:   Body Structure / Function / Physical Skills: ADL, Decreased knowledge of use of DME, Strength, Dexterity, UE functional use, IADL       Visit Diagnosis: Other lack of coordination  Tremor  Muscle weakness (generalized)  Other disturbances of skin sensation    Problem List Patient Active Problem List   Diagnosis Date Noted   Need for influenza vaccination 09/06/2021   Cerumen impaction 01/26/2015   Allergic sinusitis 01/26/2015   Tremor 04/13/2014    Fartun Paradiso, OT 12/04/2021, 8:49 AM  [facility] [address] [place], [place], [address] Phone: [phone]   Fax:  [phone]  Name: Leslie Ellison MRN: [id number] Date of Birth: 04-21-1971  Vianne Bulls, OTR/L [facility] [address] [place], [place]  [address] [phone] phone [phone] 12/04/21 8:49  AM

## 2021-12-04 NOTE — Patient Instructions (Addendum)
° ° °  Scapular Retraction (Prone)   Lie with arms at sides. Pinch shoulder blades together and raise arms a few inches from floor.  (thumbs down) Repeat 10 times per set. Do 1 sessions per day.  Scapular Retraction: Abduction / Extension (Prone)   Lie with arms out from sides 90. Pinch shoulder blades together and raise arms a few inches from floor. Palms down.  Repeat 10 times per set. Do 1 sessions per day.    All Fours Shoulder Flexion / Extension    Place hands and knees shoulder-width apart, rock back and sit on legs, then rock forward over hands and forearms.  Look up Repeat 10 times.  Do 1 sessions per day.  Upper Body Extension (All-Fours)    Raise right arm in front. Do not arch neck. Be sure to keep back flat.  Alternate arms. Repeat 10 times per set.  Do 1 sessions per day.      Scapular: Stabilization    With palms resting comfortably on table, walk feet back, gently lean back to heels and then forward over hand keeping elbows straight. Hold 10 seconds. Relax. Repeat 10 times per set.

## 2021-12-06 ENCOUNTER — Other Ambulatory Visit: Payer: Self-pay

## 2021-12-06 ENCOUNTER — Encounter: Payer: Self-pay | Admitting: Occupational Therapy

## 2021-12-06 ENCOUNTER — Ambulatory Visit: Payer: Managed Care, Other (non HMO) | Admitting: Occupational Therapy

## 2021-12-06 DIAGNOSIS — R208 Other disturbances of skin sensation: Secondary | ICD-10-CM

## 2021-12-06 DIAGNOSIS — R278 Other lack of coordination: Secondary | ICD-10-CM | POA: Diagnosis not present

## 2021-12-06 DIAGNOSIS — M6281 Muscle weakness (generalized): Secondary | ICD-10-CM

## 2021-12-06 DIAGNOSIS — R251 Tremor, unspecified: Secondary | ICD-10-CM

## 2021-12-06 NOTE — Therapy (Signed)
[facility] [address] [place], [place], [address] Phone: [phone]   Fax:  [phone]  Occupational Therapy Treatment  Patient Details  Name: Leslie Ellison MRN: [id number] Date of Birth: 30-Oct-1971 Referring Provider (OT): Dr. Andrey Spearman   Encounter Date: 12/06/2021   OT End of Session - 12/06/21 0727     Visit Number 4    Number of Visits 13    Date for OT Re-Evaluation 01/04/22    Authorization Type per pt [employer]--awaiting insurance verification    OT Start Time [phone]    OT Stop Time 0802    OT Time Calculation (min) 38 min    Activity Tolerance Patient tolerated treatment well    Behavior During Therapy [facility] for tasks assessed/performed             Past Medical History:  Diagnosis Date   Carpal tunnel syndrome on right    GERD (gastroesophageal reflux disease)    Tremor of right hand    Trigger thumb of right hand     Past Surgical History:  Procedure Laterality Date   None      There were no vitals filed for this visit.   Subjective Assessment - 12/06/21 0726     Subjective  hasn't noticed tingling/numbness much lately--seems better, reports middle finger cramped in MP flex 1x since last visit.  Pt reports built-up grip on pen helps, hasn't tried computer setting changes yet.    Pertinent History R hand tremor            PMH: trigger thumb R hand, ?CTS R hand, hx of tremor resolved R hand in years ago, GERD    Special Tests pt reprots EMG and MRI were unrearkable    Patient Stated Goals improve R hand use    Currently in Pain? No/denies              Reviewed the following from coordination HEP all with min cueing/difficulty:  rotating 2 golf balls in hand, rotating ball in fingertips, stacking coins and then manipulating in hand to place in coin bank.          OT Education - 12/06/21 0729     Education Details Reviewed Scapular stability HEP;  Red putty HEP--see pt  instructions    Person(s) Educated Patient    Methods Explanation;Demonstration;Verbal cues    Comprehension Verbalized understanding;Returned demonstration              OT Short Term Goals - 12/04/21 0841       OT SHORT TERM GOAL #1   Title Pt will be independent with initial HEP for coordination.--check STGs 12/20/20    Time 4    Period Weeks    Status New      OT SHORT TERM GOAL #2   Title Pt will verbalize understanding of AE/strategies for ADLs/IADLs (for tremors and numbness/tingling).    Time 4    Period Weeks    Status On-going   12/04/21: educated in foam grip for pen, computer accessibility features              OT Long Term Goals - 11/20/21 1335       OT LONG TERM GOAL #1   Title Pt will be independent with HEP RUE strength/wt bearing.--check LTGs 01/04/21    Time 6    Period Weeks    Status New      OT LONG TERM GOAL #2   Title Pt will  improve R grip strength by at least 8lbs to help with gripping/carrying tasks and opening containers.    Baseline 47.3lbs    Time 6    Period Weeks    Status New      OT LONG TERM GOAL #3   Title Pt will improve lateral and tip pinches by at least 2lbs each for ADLs/IADLs.    Baseline lateral 12lbs, tip 7lbs    Time 6    Period Weeks    Status New      OT LONG TERM GOAL #4   Title Pt will verbalize understanding of positioning and HEP to decr numbness/tingling.    Time 6    Period Weeks    Status New                   Plan - 12/06/21 0729     Clinical Impression Statement Pt reports decr in numbness/tingling in RUE.  Pt is progressing towards goals.    OT Occupational Profile and History Comprehensive Assessment- Review of records and extensive additional review of physical, cognitive, psychosocial history related to current functional performance    Occupational performance deficits (Please refer to evaluation for details): ADL's;IADL's;Work    Body Structure / Function / Physical Skills  ADL;Decreased knowledge of use of DME;Strength;Dexterity;UE functional use;IADL    Rehab Potential Good    Clinical Decision Making Limited treatment options, no task modification necessary    Comorbidities Affecting Occupational Performance: None    Modification or Assistance to Complete Evaluation  No modification of tasks or assist necessary to complete eval    OT Frequency 2x / week    OT Duration 6 weeks   +eval   OT Treatment/Interventions Self-care/ADL training;Fluidtherapy;Moist Heat;DME and/or AE instruction;Splinting;Therapeutic activities;Therapeutic exercise;Neuromuscular education;Cryotherapy;Paraffin;Manual Therapy;Patient/family education;Passive range of motion    Plan review putty HEP, continue with coordination and ADL strategies    Consulted and Agree with Plan of Care Patient             Patient will benefit from skilled therapeutic intervention in order to improve the following deficits and impairments:   Body Structure / Function / Physical Skills: ADL, Decreased knowledge of use of DME, Strength, Dexterity, UE functional use, IADL       Visit Diagnosis: Other lack of coordination  Tremor  Muscle weakness (generalized)  Other disturbances of skin sensation    Problem List Patient Active Problem List   Diagnosis Date Noted   Need for influenza vaccination 09/06/2021   Cerumen impaction 01/26/2015   Allergic sinusitis 01/26/2015   Tremor 04/13/2014    Westen Dinino, OT 12/06/2021, 12:54 PM  [facility] [address] [place], [place], [address] Phone: [phone]   Fax:  [phone]  Name: Leslie Ellison MRN: [id number] Date of Birth: February 06, 1971  Vianne Bulls, OTR/L [facility] [address] [place], [place]  [address] [phone] phone [phone] 12/06/21 12:54 PM

## 2021-12-06 NOTE — Patient Instructions (Signed)
° °   Extension (Assistive Putty)   Roll putty back and forth, being sure to use all fingertips. Repeat 3 times. Do 1 sessions per day.  Then pinch as below.   Palmar Pinch Strengthening (Resistive Putty)   Pinch putty between thumb and each fingertip in turn after rolling out   Grip Strengthening (Resistive Putty)   Squeeze putty using thumb and all fingers. Repeat 20 times. Do 1 sessions per day.   Finger and Thumb Extension (Resistive Putty)   With thumb and all fingers in center of putty donut, stretch out. Repeat 10 times. Do 1 sessions per day.     MP Flexion (Resistive Putty)   Bending only at large knuckles, press putty down against thumb. Keep fingertips straight. Repeat 10 times. Do 1-2 sessions per day.   Lateral Pinch Strengthening (Resistive Putty)    Squeeze between thumb and side of each finger in turn. Repeat 10 times. Do 1-2 sessions per day.

## 2021-12-11 ENCOUNTER — Ambulatory Visit: Payer: Managed Care, Other (non HMO) | Admitting: Occupational Therapy

## 2021-12-11 ENCOUNTER — Encounter: Payer: Self-pay | Admitting: Occupational Therapy

## 2021-12-11 ENCOUNTER — Other Ambulatory Visit: Payer: Self-pay

## 2021-12-11 DIAGNOSIS — R251 Tremor, unspecified: Secondary | ICD-10-CM

## 2021-12-11 DIAGNOSIS — R208 Other disturbances of skin sensation: Secondary | ICD-10-CM

## 2021-12-11 DIAGNOSIS — R278 Other lack of coordination: Secondary | ICD-10-CM | POA: Diagnosis not present

## 2021-12-11 DIAGNOSIS — M6281 Muscle weakness (generalized): Secondary | ICD-10-CM

## 2021-12-11 NOTE — Therapy (Signed)
[facility] [address] [place], [place], [address] Phone: [phone]   Fax:  [phone]  Occupational Therapy Treatment  Patient Details  Name: Leslie Ellison MRN: [id number] Date of Birth: Apr 22, 1971 Referring Provider (OT): Dr. Andrey Spearman   Encounter Date: 12/11/2021   OT End of Session - 12/11/21 0802     Visit Number 5    Number of Visits 13    Date for OT Re-Evaluation 01/04/22    Authorization Type per pt [employer]--awaiting insurance verification    OT Start Time 0803    OT Stop Time 0845    OT Time Calculation (min) 42 min    Activity Tolerance Patient tolerated treatment well    Behavior During Therapy [facility] for tasks assessed/performed             Past Medical History:  Diagnosis Date   Carpal tunnel syndrome on right    GERD (gastroesophageal reflux disease)    Tremor of right hand    Trigger thumb of right hand     Past Surgical History:  Procedure Laterality Date   None      There were no vitals filed for this visit.   Subjective Assessment - 12/11/21 0802     Subjective  Pt reports that she noticed more shaking with excitement with party.  Pt reports that she hasn't had numbness or tingling recently.  Pt reports that adjusting the mouse settings (to slow down) has helped at work.    Pertinent History R hand tremor            PMH: trigger thumb R hand, ?CTS R hand, hx of tremor resolved R hand in years ago, GERD    Special Tests pt reprots EMG and MRI were unrearkable    Patient Stated Goals improve R hand use    Currently in Pain? No/denies              Picking up blocks using gripper for sustained grip strength on level 2/black spring with min difficulty.  Placing O'connor pegs in pegboard with tweezers with min-mod difficulty initially, but improved with repetition and min cueing for positioning initially.  Pt cued to move deliberately as demo incr tremors with hesitation  with movement.  Wall push-ups with min cueing for positioning for wt. Bearing and proximal stability.  Functional reaching with RUE to place/remove clothespins with 1-8lb resistance for incr coordination and pinch strength.  Practiced writing using compensation strategies with min cueing.  Good legibility, but decr control noted after approx 4-5 sentences.    Arm bike x63min level 3 for reciprocal movement with cueing for >30rpms and equal use of RUE (initially decr smoothness of RUE movement/not equal use of RUE, but improved with cueing).            OT Education - 12/11/21 0830     Education Details Recommended tremor compensation strategies (particularly when eating) including:  stop and stretch hand, wt. bear on leg/chair/table, adjust pressure on utensil, put utensil down when talking while eating.  Recommended using foam grip when writing, scoot close to table and bring paper close to her so that elbow/forearm can be supported on table, writing on pad, stop and stretch/wt. bear frequently.  Recommended using mouse pad, stop> stretch hand>  and move deliberately (reviewed adjusting mouse settings and use of keyboard commands).    Person(s) Educated Patient    Methods Explanation;Demonstration;Verbal cues    Comprehension Verbalized understanding;Returned demonstration  OT Short Term Goals - 12/04/21 0841       OT SHORT TERM GOAL #1   Title Pt will be independent with initial HEP for coordination.--check STGs 12/20/20    Time 4    Period Weeks    Status New      OT SHORT TERM GOAL #2   Title Pt will verbalize understanding of AE/strategies for ADLs/IADLs (for tremors and numbness/tingling).    Time 4    Period Weeks    Status On-going   12/04/21: educated in foam grip for pen, computer accessibility features              OT Long Term Goals - 11/20/21 1335       OT LONG TERM GOAL #1   Title Pt will be independent with HEP RUE strength/wt  bearing.--check LTGs 01/04/21    Time 6    Period Weeks    Status New      OT LONG TERM GOAL #2   Title Pt will improve R grip strength by at least 8lbs to help with gripping/carrying tasks and opening containers.    Baseline 47.3lbs    Time 6    Period Weeks    Status New      OT LONG TERM GOAL #3   Title Pt will improve lateral and tip pinches by at least 2lbs each for ADLs/IADLs.    Baseline lateral 12lbs, tip 7lbs    Time 6    Period Weeks    Status New      OT LONG TERM GOAL #4   Title Pt will verbalize understanding of positioning and HEP to decr numbness/tingling.    Time 6    Period Weeks    Status New                   Plan - 12/11/21 0803     Clinical Impression Statement Pt is progressing towards goals with reported incr ease with mouse use and writing using compensation/adaptive strategies.    OT Occupational Profile and History Comprehensive Assessment- Review of records and extensive additional review of physical, cognitive, psychosocial history related to current functional performance    Occupational performance deficits (Please refer to evaluation for details): ADL's;IADL's;Work    Body Structure / Function / Physical Skills ADL;Decreased knowledge of use of DME;Strength;Dexterity;UE functional use;IADL    Rehab Potential Good    Clinical Decision Making Limited treatment options, no task modification necessary    Comorbidities Affecting Occupational Performance: None    Modification or Assistance to Complete Evaluation  No modification of tasks or assist necessary to complete eval    OT Frequency 2x / week    OT Duration 6 weeks   +eval   OT Treatment/Interventions Self-care/ADL training;Fluidtherapy;Moist Heat;DME and/or AE instruction;Splinting;Therapeutic activities;Therapeutic exercise;Neuromuscular education;Cryotherapy;Paraffin;Manual Therapy;Patient/family education;Passive range of motion    Plan continue with ADL strategies prn, coordination,  hand strength/review putty HEP, ?theraband ex/proximal strength    Consulted and Agree with Plan of Care Patient             Patient will benefit from skilled therapeutic intervention in order to improve the following deficits and impairments:   Body Structure / Function / Physical Skills: ADL, Decreased knowledge of use of DME, Strength, Dexterity, UE functional use, IADL       Visit Diagnosis: Other lack of coordination  Tremor  Muscle weakness (generalized)  Other disturbances of skin sensation    Problem List Patient Active Problem List  Diagnosis Date Noted   Need for influenza vaccination 09/06/2021   Cerumen impaction 01/26/2015   Allergic sinusitis 01/26/2015   Tremor 04/13/2014    Rebakah Cokley, OT 12/11/2021, 8:50 AM  [facility] [address] [place], [place], [address] Phone: [phone]   Fax:  [phone]  Name: Leslie Ellison MRN: [id number] Date of Birth: 15-Oct-1971  Vianne Bulls, OTR/L [facility] [address] [place], [place]  [address] [phone] phone [phone] 12/11/21 8:50 AM

## 2021-12-13 ENCOUNTER — Encounter: Payer: Self-pay | Admitting: Occupational Therapy

## 2021-12-13 ENCOUNTER — Ambulatory Visit: Payer: Managed Care, Other (non HMO) | Admitting: Occupational Therapy

## 2021-12-13 ENCOUNTER — Other Ambulatory Visit: Payer: Self-pay

## 2021-12-13 DIAGNOSIS — R278 Other lack of coordination: Secondary | ICD-10-CM | POA: Diagnosis not present

## 2021-12-13 DIAGNOSIS — M6281 Muscle weakness (generalized): Secondary | ICD-10-CM

## 2021-12-13 DIAGNOSIS — R251 Tremor, unspecified: Secondary | ICD-10-CM

## 2021-12-13 DIAGNOSIS — R208 Other disturbances of skin sensation: Secondary | ICD-10-CM

## 2021-12-13 NOTE — Therapy (Signed)
[facility] [address] [place], [place], [address] Phone: [phone]   Fax:  [phone]  Occupational Therapy Treatment  Patient Details  Name: ANDELYN SPADE MRN: [id number] Date of Birth: 1971/02/16 Referring Provider (OT): Dr. Andrey Spearman   Encounter Date: 12/13/2021   OT End of Session - 12/13/21 0810     Visit Number 6    Number of Visits 13    Date for OT Re-Evaluation 01/04/22    Authorization Type per pt [employer]--awaiting insurance verification    OT Start Time 0805    OT Stop Time 0844    OT Time Calculation (min) 39 min    Activity Tolerance Patient tolerated treatment well    Behavior During Therapy [facility] for tasks assessed/performed             Past Medical History:  Diagnosis Date   Carpal tunnel syndrome on right    GERD (gastroesophageal reflux disease)    Tremor of right hand    Trigger thumb of right hand     Past Surgical History:  Procedure Laterality Date   None      There were no vitals filed for this visit.   Subjective Assessment - 12/13/21 0806     Subjective  Pt reports that she misplaced her handouts    Pertinent History R hand tremor            PMH: trigger thumb R hand, ?CTS R hand, hx of tremor resolved R hand in years ago, GERD    Special Tests pt reprots EMG and MRI were unrearkable    Patient Stated Goals improve R hand use    Currently in Pain? No/denies               Reviewed red theraputty HEP.  Pt returned demo each with occasional min cueing.  Reprinted handout.  No tremor noted.  Placing O'connor pegs into pegboard with tweezers with min-mod difficulty with tremors.  However, improved with wt. Bearing and stretching hand, but incr tremors today than earlier this week.  Red theraband ex for bilateral scapular retraction (rows), shoulder ext with scapular retraction, ER, and horizontal abduction x15 each with min v.c.  Functional reaching  overhead to place small pegs in vertical pegboard for incr coordination with min difficulty/tremors noted.  Removed using in hand manipulation with min difficulty.           OT Short Term Goals - 12/13/21 4008       OT SHORT TERM GOAL #1   Title Pt will be independent with initial HEP for coordination.--check STGs 12/20/20    Time 4    Period Weeks    Status Achieved      OT SHORT TERM GOAL #2   Title Pt will verbalize understanding of AE/strategies for ADLs/IADLs (for tremors and numbness/tingling).    Time 4    Period Weeks    Status Achieved   12/04/21: educated in foam grip for pen, computer accessibility features              OT Long Term Goals - 11/20/21 1335       OT LONG TERM GOAL #1   Title Pt will be independent with HEP RUE strength/wt bearing.--check LTGs 01/04/21    Time 6    Period Weeks    Status New      OT LONG TERM GOAL #2   Title Pt will improve R grip strength by at least 8lbs  to help with gripping/carrying tasks and opening containers.    Baseline 47.3lbs    Time 6    Period Weeks    Status New      OT LONG TERM GOAL #3   Title Pt will improve lateral and tip pinches by at least 2lbs each for ADLs/IADLs.    Baseline lateral 12lbs, tip 7lbs    Time 6    Period Weeks    Status New      OT LONG TERM GOAL #4   Title Pt will verbalize understanding of positioning and HEP to decr numbness/tingling.    Time 6    Period Weeks    Status New                   Plan - 12/13/21 4604     Clinical Impression Statement Pt continues to progress towards goals and tolerating strengthening well.  Tremors continue to fluctuate, more with initiation of movement and holding positions, but some during fine motor tasks.  No tremors observed with strengthening tasks.    OT Occupational Profile and History Comprehensive Assessment- Review of records and extensive additional review of physical, cognitive, psychosocial history related to current functional  performance    Occupational performance deficits (Please refer to evaluation for details): ADL's;IADL's;Work    Body Structure / Function / Physical Skills ADL;Decreased knowledge of use of DME;Strength;Dexterity;UE functional use;IADL    Rehab Potential Good    Clinical Decision Making Limited treatment options, no task modification necessary    Comorbidities Affecting Occupational Performance: None    Modification or Assistance to Complete Evaluation  No modification of tasks or assist necessary to complete eval    OT Frequency 2x / week    OT Duration 6 weeks   +eval   OT Treatment/Interventions Self-care/ADL training;Fluidtherapy;Moist Heat;DME and/or AE instruction;Splinting;Therapeutic activities;Therapeutic exercise;Neuromuscular education;Cryotherapy;Paraffin;Manual Therapy;Patient/family education;Passive range of motion    Plan continue with ADL strategies prn, coordination, hand strength/review putty HEP, ?theraband ex/proximal strength    Consulted and Agree with Plan of Care Patient             Patient will benefit from skilled therapeutic intervention in order to improve the following deficits and impairments:   Body Structure / Function / Physical Skills: ADL, Decreased knowledge of use of DME, Strength, Dexterity, UE functional use, IADL       Visit Diagnosis: Other lack of coordination  Tremor  Muscle weakness (generalized)  Other disturbances of skin sensation    Problem List Patient Active Problem List   Diagnosis Date Noted   Need for influenza vaccination 09/06/2021   Cerumen impaction 01/26/2015   Allergic sinusitis 01/26/2015   Tremor 04/13/2014    Vianne Bulls, OT 12/13/2021, 8:40 AM  [facility] [address] [place], [place], [address] Phone: [phone]   Fax:  [phone]  Name: RAYANNA MATUSIK MRN: [id number] Date of Birth: 1970-12-28  Vianne Bulls, OTR/L [facility] [address] [place], [place]  [address] [phone] phone [phone] 12/13/21 8:40 AM

## 2021-12-18 ENCOUNTER — Encounter: Payer: Self-pay | Admitting: Occupational Therapy

## 2021-12-18 ENCOUNTER — Other Ambulatory Visit: Payer: Self-pay

## 2021-12-18 ENCOUNTER — Ambulatory Visit: Payer: Managed Care, Other (non HMO) | Admitting: Occupational Therapy

## 2021-12-18 DIAGNOSIS — R251 Tremor, unspecified: Secondary | ICD-10-CM

## 2021-12-18 DIAGNOSIS — M6281 Muscle weakness (generalized): Secondary | ICD-10-CM

## 2021-12-18 DIAGNOSIS — R278 Other lack of coordination: Secondary | ICD-10-CM

## 2021-12-18 DIAGNOSIS — R208 Other disturbances of skin sensation: Secondary | ICD-10-CM

## 2021-12-18 NOTE — Patient Instructions (Signed)
° °  Strengthening: Resisted Extension   Attach one end to door.  Hold tubing in one hand, arm forward. Pull arm back, elbow straight. Repeat 15 times per set. Do 1 sessions 3-4x/week   Scapular Retraction: Rowing (Eccentric) - Arms - Side (Resistance Band)    Hold end of band in each hand. Pull back until elbows are slightly behind trunk. Keep elbows by sides, thumbs up. Slowly release for 3-5 seconds.  Repeat 15 times per set. Do 1 sessions 3-4x/week   Resisted Horizontal Abduction: Bilateral   Sit or stand, tubing in both hands, palms down and arms out in front. Keeping arms straight, pinch shoulder blades together and stretch arms out. Repeat 15 times per set. Do 1 sessions 3-4x/week   Shoulder Retraction / External Rotation With Band    With band held in front, keep elbows at side while rotating hands apart and pulling shoulder blades back. Repeat 15 times per set. Do 1 sessions 3-4x/week

## 2021-12-18 NOTE — Therapy (Signed)
[facility] [address] [place], [place], [address] Phone: [phone]   Fax:  [phone]  Occupational Therapy Treatment  Patient Details  Name: Leslie Ellison MRN: [id number] Date of Birth: December 07, 1970 Referring Provider (OT): Dr. Andrey Spearman   Encounter Date: 12/18/2021   OT End of Session - 12/18/21 0809     Visit Number 7    Number of Visits 13    Date for OT Re-Evaluation 01/04/22    Authorization Type per pt [employer]--awaiting insurance verification    OT Start Time [phone]    OT Stop Time 0845    OT Time Calculation (min) 39 min    Activity Tolerance Patient tolerated treatment well    Behavior During Therapy [facility] for tasks assessed/performed             Past Medical History:  Diagnosis Date   Carpal tunnel syndrome on right    GERD (gastroesophageal reflux disease)    Tremor of right hand    Trigger thumb of right hand     Past Surgical History:  Procedure Laterality Date   None      There were no vitals filed for this visit.   Subjective Assessment - 12/18/21 0809     Subjective  has been working on putty at work    Pertinent History R hand tremor            PMH: trigger thumb R hand, ?CTS R hand, hx of tremor resolved R hand in years ago, GERD    Special Tests pt reprots EMG and MRI were unrearkable    Patient Stated Goals improve R hand use    Currently in Pain? No/denies             Completing Purdue Pegboard for incr coordination with min difficuty.  Picking up blocks with gripper set on level 2/black spring for sustained grip strength with min difficulty.  Functional reaching with RUE to place small pegs in vertical pegboard with min difficulty with coordination, minimal tremors noted.    Arm bike x8min, Level 4 for reciprocal movement and strengthening.        OT Education - 12/18/21 0825     Education Details Red theraband HEP for scapular stability/strength--see  pt instructions.  reviewed tremor compensation strategies (avoid rushing/allow incr time, use wt. bearing, relaxation/breathing techniques)    Person(s) Educated Patient    Methods Explanation;Demonstration;Verbal cues    Comprehension Verbalized understanding;Returned demonstration              OT Short Term Goals - 12/13/21 7001       OT SHORT TERM GOAL #1   Title Pt will be independent with initial HEP for coordination.--check STGs 12/20/20    Time 4    Period Weeks    Status Achieved      OT SHORT TERM GOAL #2   Title Pt will verbalize understanding of AE/strategies for ADLs/IADLs (for tremors and numbness/tingling).    Time 4    Period Weeks    Status Achieved   12/04/21: educated in foam grip for pen, computer accessibility features              OT Long Term Goals - 12/18/21 0838       OT LONG TERM GOAL #1   Title Pt will be independent with HEP RUE strength/wt bearing.--check LTGs 01/04/21    Time 6    Period Weeks    Status New  OT LONG TERM GOAL #2   Title Pt will improve R grip strength by at least 8lbs to help with gripping/carrying tasks and opening containers.    Baseline 47.3lbs    Time 6    Period Weeks    Status On-going   12/08/21:  43.8lbs     OT LONG TERM GOAL #3   Title Pt will improve lateral and tip pinches by at least 2lbs each for ADLs/IADLs.    Baseline lateral 12lbs, tip 7lbs    Time 6    Period Weeks    Status New      OT LONG TERM GOAL #4   Title Pt will verbalize understanding of positioning and HEP to decr numbness/tingling.    Time 6    Period Weeks    Status New                   Plan - 12/18/21 0809     Clinical Impression Statement Pt continues to progress towards goals and tolerating strengthening well.  Tremors continue to fluctuate, more with initiation of movement and holding positions today.    OT Occupational Profile and History Comprehensive Assessment- Review of records and extensive additional review  of physical, cognitive, psychosocial history related to current functional performance    Occupational performance deficits (Please refer to evaluation for details): ADL's;IADL's;Work    Body Structure / Function / Physical Skills ADL;Decreased knowledge of use of DME;Strength;Dexterity;UE functional use;IADL    Rehab Potential Good    Clinical Decision Making Limited treatment options, no task modification necessary    Comorbidities Affecting Occupational Performance: None    Modification or Assistance to Complete Evaluation  No modification of tasks or assist necessary to complete eval    OT Frequency 2x / week    OT Duration 6 weeks   +eval   OT Treatment/Interventions Self-care/ADL training;Fluidtherapy;Moist Heat;DME and/or AE instruction;Splinting;Therapeutic activities;Therapeutic exercise;Neuromuscular education;Cryotherapy;Paraffin;Manual Therapy;Patient/family education;Passive range of motion    Plan continue with ADL strategies prn, coordination, ?update putty HEP    Consulted and Agree with Plan of Care Patient             Patient will benefit from skilled therapeutic intervention in order to improve the following deficits and impairments:   Body Structure / Function / Physical Skills: ADL, Decreased knowledge of use of DME, Strength, Dexterity, UE functional use, IADL       Visit Diagnosis: Other lack of coordination  Tremor  Muscle weakness (generalized)  Other disturbances of skin sensation    Problem List Patient Active Problem List   Diagnosis Date Noted   Need for influenza vaccination 09/06/2021   Cerumen impaction 01/26/2015   Allergic sinusitis 01/26/2015   Tremor 04/13/2014    Vin Yonke, OT 12/18/2021, 9:00 AM  [place] [address] [place], [place], [address] Phone: [phone]   Fax:  [phone]  Name: Leslie Ellison MRN: [id number] Date of Birth: 07-15-1971  Vianne Bulls, OTR/L [facility] [address] [place], [place]  [address] [phone] phone [phone] 12/18/21 9:00 AM

## 2021-12-20 ENCOUNTER — Ambulatory Visit: Payer: Managed Care, Other (non HMO) | Admitting: Occupational Therapy

## 2021-12-20 ENCOUNTER — Other Ambulatory Visit: Payer: Self-pay

## 2021-12-20 ENCOUNTER — Encounter: Payer: Self-pay | Admitting: Occupational Therapy

## 2021-12-20 DIAGNOSIS — M6281 Muscle weakness (generalized): Secondary | ICD-10-CM

## 2021-12-20 DIAGNOSIS — R251 Tremor, unspecified: Secondary | ICD-10-CM

## 2021-12-20 DIAGNOSIS — R208 Other disturbances of skin sensation: Secondary | ICD-10-CM

## 2021-12-20 DIAGNOSIS — R278 Other lack of coordination: Secondary | ICD-10-CM

## 2021-12-20 NOTE — Therapy (Signed)
[facility] [address] [place], [place], [address] Phone: [phone]   Fax:  [phone]  Occupational Therapy Treatment  Patient Details  Name: Leslie Ellison MRN: [id number] Date of Birth: Mar 26, 1971 Referring Provider (OT): Dr. Andrey Spearman   Encounter Date: 12/20/2021   OT End of Session - 12/20/21 0807     Visit Number 8    Number of Visits 13    Date for OT Re-Evaluation 01/04/22    Authorization Type per pt [employer]--awaiting insurance verification    OT Start Time [phone]    OT Stop Time 0845    OT Time Calculation (min) 39 min    Activity Tolerance Patient tolerated treatment well    Behavior During Therapy [facility] for tasks assessed/performed             Past Medical History:  Diagnosis Date   Carpal tunnel syndrome on right    GERD (gastroesophageal reflux disease)    Tremor of right hand    Trigger thumb of right hand     Past Surgical History:  Procedure Laterality Date   None      There were no vitals filed for this visit.   Subjective Assessment - 12/20/21 0807     Subjective  pt reports that built-up grip helps with writing and reports that working on computer has been better.    Pertinent History R hand tremor            PMH: trigger thumb R hand, ?CTS R hand, hx of tremor resolved R hand in years ago, GERD    Special Tests pt reprots EMG and MRI were unrearkable    Patient Stated Goals improve R hand use    Currently in Pain? No/denies               Reviewed scapular stability HEP and pt returned demo each with min cueing for wrist positioning for scapular exercises in prone as pt tends to keep wrist in flex (encouraged neutral wrist positioning to decr/prevent paraesthesias).  Checked grip/pinch strengths--see below.  Reviewed theraband HEP (red) and pt returned demo each x15 with occasional min v.c.  Picking up and stacking coins without significant difficulty/tremors  today, then manipulated in hand to place in coin bank 1 at a time without significant difficulty/tremor.       OT Education - 12/20/21 0817     Education Details Updated putty HEP to green--pt returned demo each (min v.c. intermittently for positioning/shoulder hike)    Person(s) Educated Patient    Methods Explanation;Demonstration;Verbal cues    Comprehension Verbalized understanding;Returned demonstration              OT Short Term Goals - 12/13/21 5732       OT SHORT TERM GOAL #1   Title Pt will be independent with initial HEP for coordination.--check STGs 12/20/20    Time 4    Period Weeks    Status Achieved      OT SHORT TERM GOAL #2   Title Pt will verbalize understanding of AE/strategies for ADLs/IADLs (for tremors and numbness/tingling).    Time 4    Period Weeks    Status Achieved   12/04/21: educated in foam grip for pen, computer accessibility features              OT Long Term Goals - 12/20/21 Junction #1   Title Pt will be independent with  HEP RUE strength/wt bearing.--check LTGs 01/04/21    Time 6    Period Weeks    Status Achieved      OT LONG TERM GOAL #2   Title Pt will improve R grip strength by at least 8lbs to help with gripping/carrying tasks and opening containers.    Baseline 47.3lbs    Time 6    Period Weeks    Status On-going   12/08/21:  43.8lbs     OT LONG TERM GOAL #3   Title Pt will improve lateral and tip pinches by at least 2lbs each for ADLs/IADLs.    Baseline lateral 12lbs, tip 7lbs    Time 6    Period Weeks    Status Achieved   12/20/20:  lateral-14lbs, tip-10lbs     OT LONG TERM GOAL #4   Title Pt will verbalize understanding of positioning and HEP to decr numbness/tingling.    Time 6    Period Weeks    Status Achieved                   Plan - 12/20/21 6237     Clinical Impression Statement Pt is progressing towards goals and tolerating strengthening well.  Pt demo improved pinch  strength today.    OT Occupational Profile and History Comprehensive Assessment- Review of records and extensive additional review of physical, cognitive, psychosocial history related to current functional performance    Occupational performance deficits (Please refer to evaluation for details): ADL's;IADL's;Work    Body Structure / Function / Physical Skills ADL;Decreased knowledge of use of DME;Strength;Dexterity;UE functional use;IADL    Rehab Potential Good    Clinical Decision Making Limited treatment options, no task modification necessary    Comorbidities Affecting Occupational Performance: None    Modification or Assistance to Complete Evaluation  No modification of tasks or assist necessary to complete eval    OT Frequency 2x / week    OT Duration 6 weeks   +eval   OT Treatment/Interventions Self-care/ADL training;Fluidtherapy;Moist Heat;DME and/or AE instruction;Splinting;Therapeutic activities;Therapeutic exercise;Neuromuscular education;Cryotherapy;Paraffin;Manual Therapy;Patient/family education;Passive range of motion    Plan continue with ADL strategies prn, coordination, ?update putty HEP    Consulted and Agree with Plan of Care Patient             Patient will benefit from skilled therapeutic intervention in order to improve the following deficits and impairments:   Body Structure / Function / Physical Skills: ADL, Decreased knowledge of use of DME, Strength, Dexterity, UE functional use, IADL       Visit Diagnosis: Other lack of coordination  Tremor  Muscle weakness (generalized)  Other disturbances of skin sensation    Problem List Patient Active Problem List   Diagnosis Date Noted   Need for influenza vaccination 09/06/2021   Cerumen impaction 01/26/2015   Allergic sinusitis 01/26/2015   Tremor 04/13/2014    Tuvia Woodrick, OT 12/20/2021, 8:46 AM  [place] [address] [place], [place], [address] Phone: [phone]   Fax:  [phone]  Name: SONYA GUNNOE MRN: [id number] Date of Birth: Jan 01, 1971  Vianne Bulls, OTR/L [facility] [address] [place], [place]  [address] [phone] phone [phone] 12/20/21 8:46 AM

## 2021-12-25 ENCOUNTER — Ambulatory Visit: Payer: Managed Care, Other (non HMO) | Admitting: Occupational Therapy

## 2021-12-25 ENCOUNTER — Other Ambulatory Visit: Payer: Self-pay

## 2021-12-25 ENCOUNTER — Encounter: Payer: Self-pay | Admitting: Occupational Therapy

## 2021-12-25 DIAGNOSIS — R251 Tremor, unspecified: Secondary | ICD-10-CM

## 2021-12-25 DIAGNOSIS — M6281 Muscle weakness (generalized): Secondary | ICD-10-CM

## 2021-12-25 DIAGNOSIS — R278 Other lack of coordination: Secondary | ICD-10-CM | POA: Diagnosis not present

## 2021-12-25 DIAGNOSIS — R208 Other disturbances of skin sensation: Secondary | ICD-10-CM

## 2021-12-25 NOTE — Therapy (Signed)
[facility] [address] [place], [place], [address] Phone: [phone]   Fax:  [phone]  Occupational Therapy Treatment  Patient Details  Name: Leslie Ellison MRN: [id number] Date of Birth: Dec 05, 1970 Referring Provider (OT): Dr. Andrey Spearman   Encounter Date: 12/25/2021   OT End of Session - 12/25/21 0806     Visit Number 9    Number of Visits 13    Date for OT Re-Evaluation 01/04/22    Authorization Type per pt [employer]--20% coinsurance, no visit limit/auth    OT Start Time 0803    OT Stop Time 0843    OT Time Calculation (min) 40 min    Activity Tolerance Patient tolerated treatment well    Behavior During Therapy [facility] for tasks assessed/performed             Past Medical History:  Diagnosis Date   Carpal tunnel syndrome on right    GERD (gastroesophageal reflux disease)    Tremor of right hand    Trigger thumb of right hand     Past Surgical History:  Procedure Laterality Date   None      There were no vitals filed for this visit.   Subjective Assessment - 12/25/21 0805     Subjective  nothing new    Pertinent History R hand tremor            PMH: trigger thumb R hand, ?CTS R hand, hx of tremor resolved R hand in years ago, GERD    Special Tests pt reprots EMG and MRI were unrearkable    Patient Stated Goals improve R hand use    Currently in Pain? No/denies               Placing [place] pegs in with tweezers with min cueing, initially with incr tremors (appears to be related to anxiety/anticipating difficulty), then decr with repetition and use of strategies (stop, stretch hand, breathe, move deliberately).  Functional reaching to place/remove clothespins with 1-8lb resistance on vertical pole for incr strength and coordination.    Picking up blocks using gripper set on level 3 (black spring) initially then after approx 8 blocks reduced to level 2 due to difficulty, for incr  sustained grip strength.  Threading beads onto fishing line for bilateral hand coordination with no tremors noted.    Arm bike x 66min level 6 for reciprocal movement/conditioning.           OT Education - 12/25/21 0814     Education Details Updated theraband HEP to green--pt returned demo each x10 with min v.c. intermittenty for positioning/shoulder hike    Person(s) Educated Patient    Methods Explanation;Demonstration;Verbal cues    Comprehension Verbalized understanding;Returned demonstration              OT Short Term Goals - 12/13/21 1660       OT SHORT TERM GOAL #1   Title Pt will be independent with initial HEP for coordination.--check STGs 12/20/20    Time 4    Period Weeks    Status Achieved      OT SHORT TERM GOAL #2   Title Pt will verbalize understanding of AE/strategies for ADLs/IADLs (for tremors and numbness/tingling).    Time 4    Period Weeks    Status Achieved   12/04/21: educated in foam grip for pen, computer accessibility features              OT Long Term Goals -  12/20/21 0817       OT LONG TERM GOAL #1   Title Pt will be independent with HEP RUE strength/wt bearing.--check LTGs 01/04/21    Time 6    Period Weeks    Status Achieved      OT LONG TERM GOAL #2   Title Pt will improve R grip strength by at least 8lbs to help with gripping/carrying tasks and opening containers.    Baseline 47.3lbs    Time 6    Period Weeks    Status On-going   12/08/21:  43.8lbs     OT LONG TERM GOAL #3   Title Pt will improve lateral and tip pinches by at least 2lbs each for ADLs/IADLs.    Baseline lateral 12lbs, tip 7lbs    Time 6    Period Weeks    Status Achieved   12/20/20:  lateral-14lbs, tip-10lbs     OT LONG TERM GOAL #4   Title Pt will verbalize understanding of positioning and HEP to decr numbness/tingling.    Time 6    Period Weeks    Status Achieved                   Plan - 12/25/21 9562     Clinical Impression Statement Pt  is progressing towards goals and tolerating strengthening well.    OT Occupational Profile and History Comprehensive Assessment- Review of records and extensive additional review of physical, cognitive, psychosocial history related to current functional performance    Occupational performance deficits (Please refer to evaluation for details): ADL's;IADL's;Work    Body Structure / Function / Physical Skills ADL;Decreased knowledge of use of DME;Strength;Dexterity;UE functional use;IADL    Rehab Potential Good    Clinical Decision Making Limited treatment options, no task modification necessary    Comorbidities Affecting Occupational Performance: None    Modification or Assistance to Complete Evaluation  No modification of tasks or assist necessary to complete eval    OT Frequency 2x / week    OT Duration 6 weeks   +eval   OT Treatment/Interventions Self-care/ADL training;Fluidtherapy;Moist Heat;DME and/or AE instruction;Splinting;Therapeutic activities;Therapeutic exercise;Neuromuscular education;Cryotherapy;Paraffin;Manual Therapy;Patient/family education;Passive range of motion    Plan check remaining goals and anticipate d/c next session    Consulted and Agree with Plan of Care Patient             Patient will benefit from skilled therapeutic intervention in order to improve the following deficits and impairments:   Body Structure / Function / Physical Skills: ADL, Decreased knowledge of use of DME, Strength, Dexterity, UE functional use, IADL       Visit Diagnosis: Other lack of coordination  Tremor  Muscle weakness (generalized)  Other disturbances of skin sensation    Problem List Patient Active Problem List   Diagnosis Date Noted   Need for influenza vaccination 09/06/2021   Cerumen impaction 01/26/2015   Allergic sinusitis 01/26/2015   Tremor 04/13/2014    [facility], OT 12/25/2021, 8:43 AM  [place] [address] [place], [place], [address] Phone: [phone]   Fax:  [phone]  Name: Leslie Ellison MRN: [id number] Date of Birth: 11-13-1971  Vianne Bulls, OTR/L [facility] [address] [place], [place]  [address] [phone] phone [phone] 12/25/21 8:43 AM

## 2021-12-27 ENCOUNTER — Encounter: Payer: Self-pay | Admitting: Occupational Therapy

## 2021-12-27 ENCOUNTER — Ambulatory Visit: Payer: Managed Care, Other (non HMO) | Admitting: Occupational Therapy

## 2021-12-27 ENCOUNTER — Other Ambulatory Visit: Payer: Self-pay

## 2021-12-27 DIAGNOSIS — R251 Tremor, unspecified: Secondary | ICD-10-CM

## 2021-12-27 DIAGNOSIS — M6281 Muscle weakness (generalized): Secondary | ICD-10-CM

## 2021-12-27 DIAGNOSIS — R278 Other lack of coordination: Secondary | ICD-10-CM | POA: Diagnosis not present

## 2021-12-27 DIAGNOSIS — R208 Other disturbances of skin sensation: Secondary | ICD-10-CM

## 2021-12-27 NOTE — Therapy (Addendum)
[facility] [address] [place], [place], [address] Phone: [phone]   Fax:  [phone]  Occupational Therapy Evaluation  Patient Details  Name: Leslie Ellison MRN: [id number] Date of Birth: 10/03/1971 Referring Provider (OT): Dr. Andrey Spearman   Encounter Date: 12/27/2021   OT End of Session - 12/27/21 0847     Visit Number 10    Number of Visits 13    Date for OT Re-Evaluation 01/04/22    Authorization Type per pt [employer]--20% coinsurance, no visit limit/auth    OT Start Time 0847    OT Stop Time 0930    OT Time Calculation (min) 43 min    Activity Tolerance Patient tolerated treatment well    Behavior During Therapy [facility] for tasks assessed/performed             Past Medical History:  Diagnosis Date   Carpal tunnel syndrome on right    GERD (gastroesophageal reflux disease)    Tremor of right hand    Trigger thumb of right hand     Past Surgical History:  Procedure Laterality Date   None      There were no vitals filed for this visit.   Subjective Assessment - 12/27/21 0847     Subjective  Pt reports that exercises are going well.  Pt reports rare numbness/tingling and is aware of what to do if it occurs.    Pertinent History R hand tremor            PMH: trigger thumb R hand, ?CTS R hand, hx of tremor resolved R hand in years ago, GERD    Special Tests pt reprots EMG and MRI were unrearkable    Patient Stated Goals improve R hand use    Currently in Pain? No/denies              Picking up blocks using gripper set on level 3 (black spring) for incr sustained grip strength with min-mod difficulty.  Pt able to complete all on level 3 today.  Picking up grooved pegs and placing in pegboard using in-hand manipulation with min difficulty with placing due to tremors.  Reviewed green theraband HEP.  Pt returned demo each x10.  Continuous cursive "l" with mod difficulty with tremors,  particularly initially, then min difficulty (improved on 2nd and 3rd attempts).  Writing 2 sentences with good legibility (printed)  Placing [place] pegs in with tweezers with min cueing, initially for strategies, but tremors/difficulty decr with repetition and use of strategies  Peeling and sticking stickers on line (simulate using labels for work tasks) with no significant difficulty/good accuracy.    Tearing transpore tape (to simulate work tasks) with no significant difficulty, but min cueing for R shoulder hike.   Tying continuous knots and then braiding with no significant difficulty for incr coordination.    Functional reaching to place/remove clothespins with 1-8lb resistance on vertical pole for incr strength and coordination.    Checked remaining goals and discussed progress and d/c plan.  Pt agrees reports that she feels that therapy has helped.        OT Short Term Goals - 12/13/21 2778       OT SHORT TERM GOAL #1   Title Pt will be independent with initial HEP for coordination.--check STGs 12/20/20    Time 4    Period Weeks    Status Achieved      OT SHORT TERM GOAL #2   Title Pt will verbalize  understanding of AE/strategies for ADLs/IADLs (for tremors and numbness/tingling).    Time 4    Period Weeks    Status Achieved   12/04/21: educated in foam grip for pen, computer accessibility features              OT Long Term Goals - 12/27/21 0850       OT LONG TERM GOAL #1   Title Pt will be independent with HEP RUE strength/wt bearing.--check LTGs 01/04/21    Time 6    Period Weeks    Status Achieved      OT LONG TERM GOAL #2   Title Pt will improve R grip strength by at least 8lbs to help with gripping/carrying tasks and opening containers.    Baseline 47.3lbs    Time 6    Period Weeks    Status Achieved   12/08/21:  43.8lbs.  12/27/21:  55.3lbs     OT LONG TERM GOAL #3   Title Pt will improve lateral and tip pinches by at least 2lbs each for ADLs/IADLs.     Baseline lateral 12lbs, tip 7lbs    Time 6    Period Weeks    Status Achieved   12/20/20:  lateral-14lbs, tip-10lbs     OT LONG TERM GOAL #4   Title Pt will verbalize understanding of positioning and HEP to decr numbness/tingling.    Time 6    Period Weeks    Status Achieved   12/27/21:  pt reports that numbness/tingling improved and verbalizes understanding of stretches/nerve glides and positioning                  Plan - 12/27/21 0847     Clinical Impression Statement Pt has met all therapy goals and is appropriate for d/c.    OT Occupational Profile and History Comprehensive Assessment- Review of records and extensive additional review of physical, cognitive, psychosocial history related to current functional performance    Occupational performance deficits (Please refer to evaluation for details): ADL's;IADL's;Work    Body Structure / Function / Physical Skills ADL;Decreased knowledge of use of DME;Strength;Dexterity;UE functional use;IADL    Rehab Potential Good    Clinical Decision Making Limited treatment options, no task modification necessary    Comorbidities Affecting Occupational Performance: None    Modification or Assistance to Complete Evaluation  No modification of tasks or assist necessary to complete eval    OT Frequency 2x / week    OT Duration 6 weeks   +eval   OT Treatment/Interventions Self-care/ADL training;Fluidtherapy;Moist Heat;DME and/or AE instruction;Splinting;Therapeutic activities;Therapeutic exercise;Neuromuscular education;Cryotherapy;Paraffin;Manual Therapy;Patient/family education;Passive range of motion    Plan d/c OT    Consulted and Agree with Plan of Care Patient             Patient will benefit from skilled therapeutic intervention in order to improve the following deficits and impairments:   Body Structure / Function / Physical Skills: ADL, Decreased knowledge of use of DME, Strength, Dexterity, UE functional use, IADL        Visit Diagnosis: Other lack of coordination  Tremor  Muscle weakness (generalized)  Other disturbances of skin sensation    Problem List Patient Active Problem List   Diagnosis Date Noted   Need for influenza vaccination 09/06/2021   Cerumen impaction 01/26/2015   Allergic sinusitis 01/26/2015   Tremor 04/13/2014    OCCUPATIONAL THERAPY DISCHARGE SUMMARY  Visits from Start of Care: 10  Current functional level related to goals / functional outcomes: See above.  Pt met all goals.   Remaining deficits: --Tremors resulting in decr coordination (tremor continues to be present intermittently with more precise fine motor tasks and appears to increase with excitement/anxiety--improves with strategies and use of deliberate movements)  --decr strength--improved --numbness/tingling (now rare)   Education / Equipment: Pt instructed in HEP, AE/compensation strategies for ADLs/IADLs, proper positioning to decr numbness/tingling and prevent pain.  Pt verbalized understanding of all education provided.  Patient agrees to discharge. Patient goals were met. Patient is being discharged due to meeting the stated rehab goals.Vianne Bulls, OT 12/27/2021, 10:38 AM  [facility] [address] [place], [place], [address] Phone: [phone]   Fax:  [phone]  Name: Leslie Ellison MRN: [id number] Date of Birth: 04-Dec-1970  Vianne Bulls, OTR/L [facility] [address] [place], [place]  [address] [phone] phone [phone] 12/27/21 10:38 AM

## 2022-05-25 IMAGING — US US FNA BIOPSY THYROID 1ST LESION
1 series · 13 of 17 positions shown · non-contrast
Comparison: Ultrasound done September 13, 2020

MEDICATIONS:
1% lidocaine 3 mL

COMPLICATIONS:
None immediate.

INDICATION: Indeterminate thyroid nodule

EXAM:
ULTRASOUND GUIDED FINE NEEDLE ASPIRATION OF INDETERMINATE THYROID
NODULE
TECHNIQUE: Informed written consent was obtained from the patient after a
discussion of the risks, benefits and alternatives to treatment.
Questions regarding the procedure were encouraged and answered. A
timeout was performed prior to the initiation of the procedure.

[Series 1: us fna biopsy thyroid 1st lesion · 0.06mm/px · 17 acquisitions, 13 frames shown]
[im 1/17]
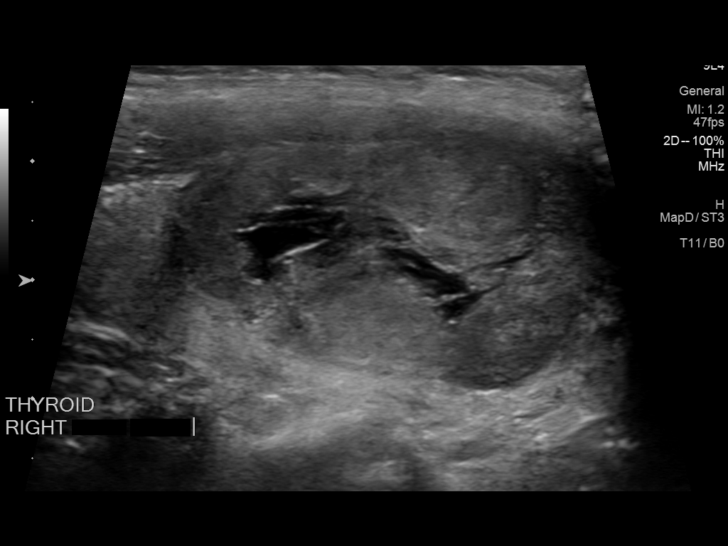
[im 2/17]
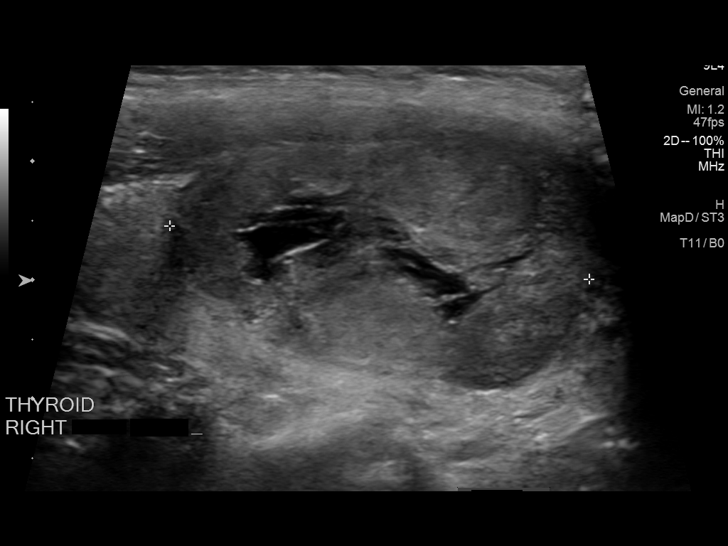
[im 4/17]
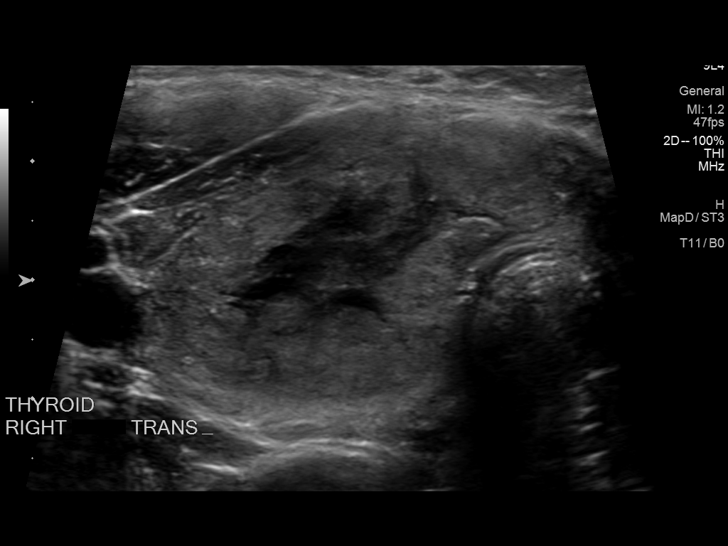
[im 5/17]
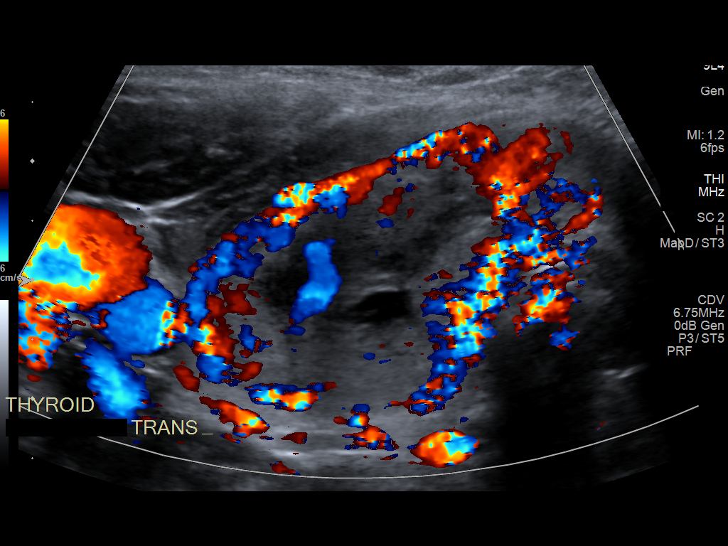
[im 6/17]
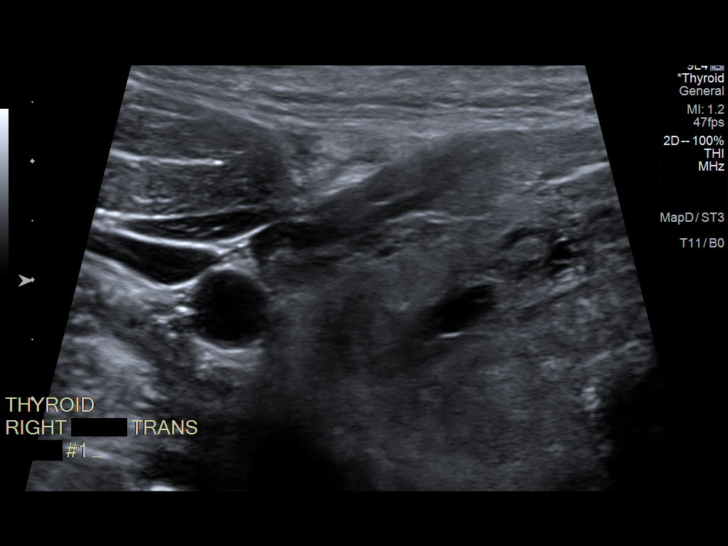
[im 8/17]
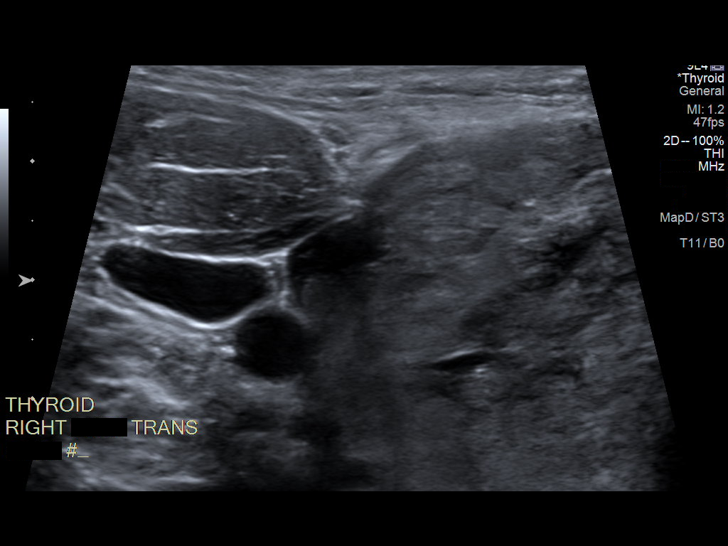
[im 9/17]
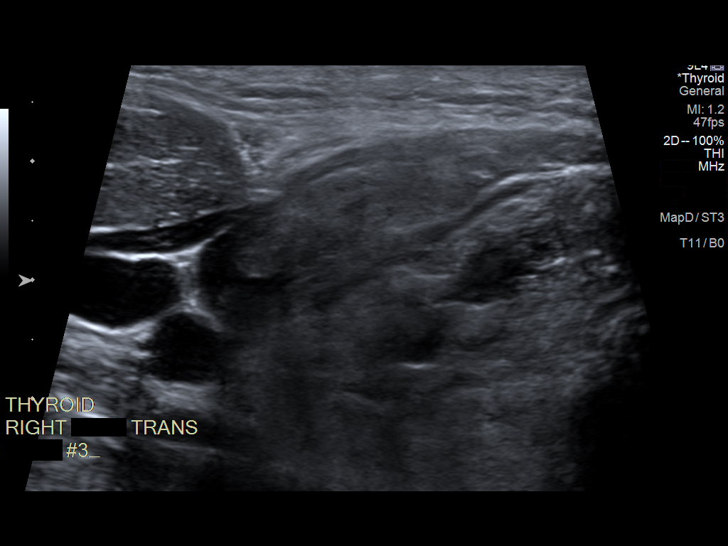
[im 10/17]
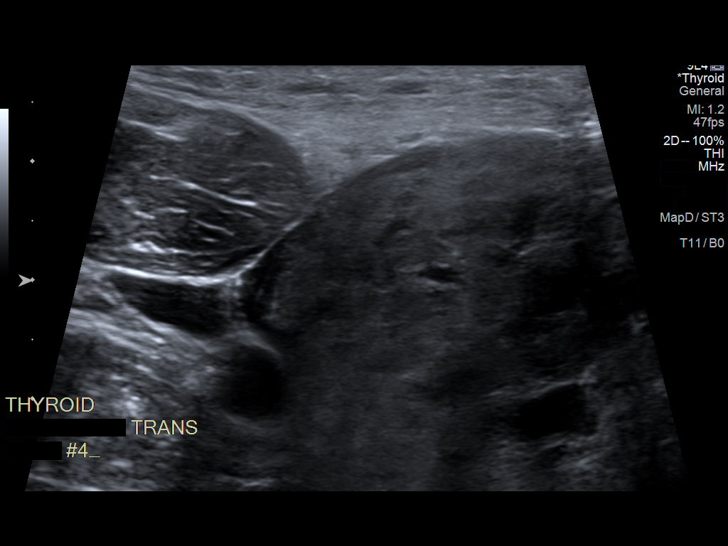
[im 12/17]
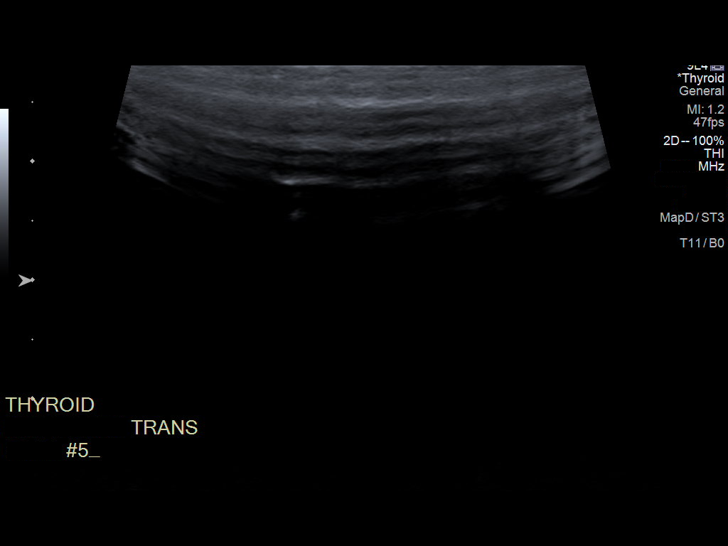
[im 13/17]
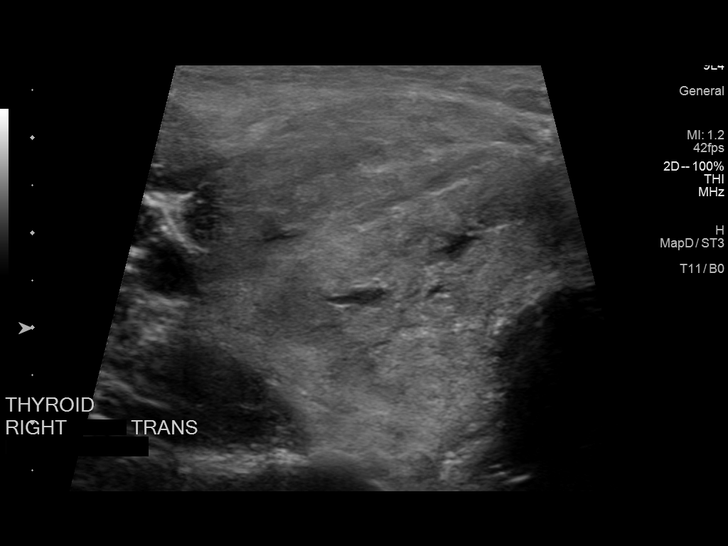
[im 14/17]
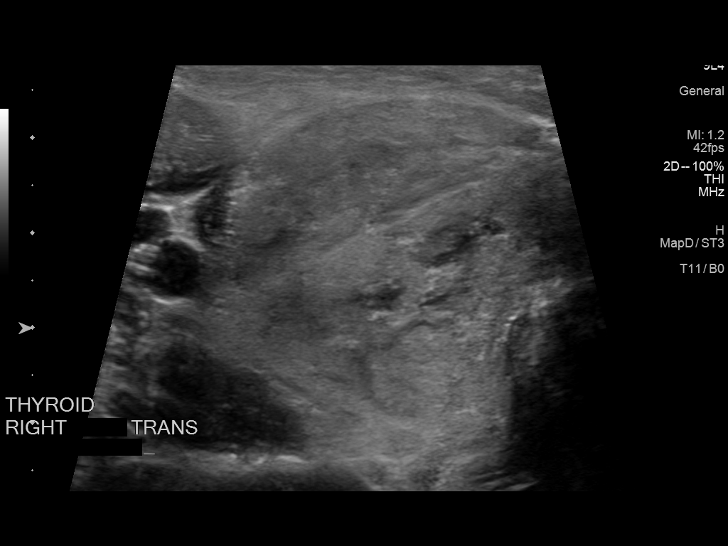
[im 16/17]
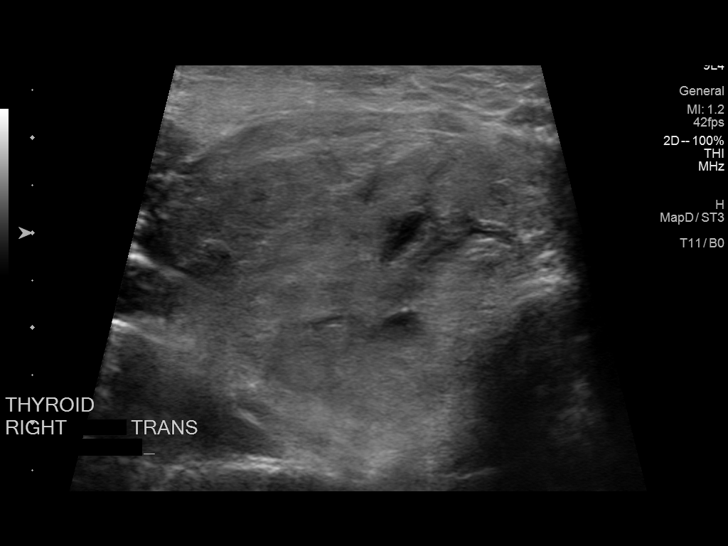
[im 17/17]
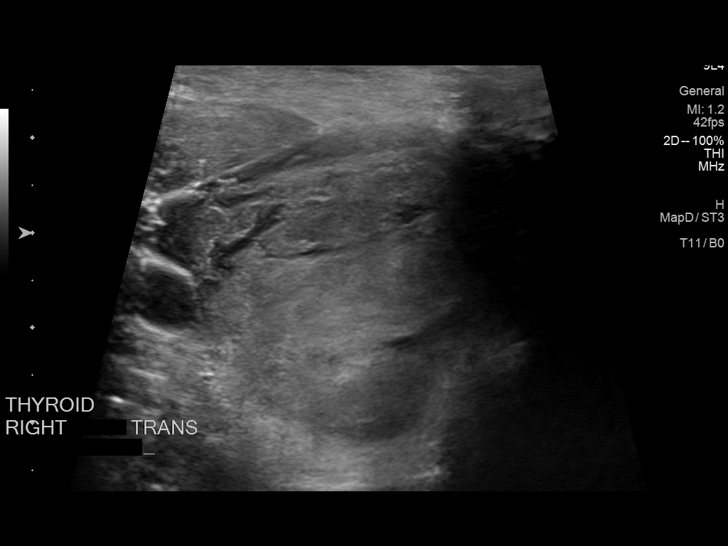

[13 of 17 positions shown; findings below may reference images not displayed]

Pre-procedural ultrasound scanning demonstrated unchanged size and
appearance of the indeterminate nodule within the right mid lobe of
the thyroid.

The procedure was planned. The neck was prepped in the usual sterile
fashion, and a sterile drape was applied covering the operative
field. A timeout was performed prior to the initiation of the
procedure. Local anesthesia was provided with 1% lidocaine.

Under direct ultrasound guidance, 5 FNA biopsies were performed of
the right thyroid nodule with a 25 gauge needle. Multiple ultrasound
images were saved for procedural documentation purposes. The samples
were prepared and submitted to pathology.

Limited post procedural scanning was negative for hematoma or
additional complication. Dressings were placed. The patient
tolerated the above procedures procedure well without immediate
postprocedural complication.
FINDINGS: FINDINGS
Nodule reference number based on prior diagnostic ultrasound: 1

Maximum size: 3.7 cm

Location: Right  ;  Mid

ACR TI-RADS risk category:  TR4

Reason for biopsy: meets ACR TI-RADS criteria

Ultrasound imaging confirms appropriate placement of the needles
within the thyroid nodule.
IMPRESSION: Technically successful ultrasound guided fine needle aspiration of
the right mid thyroid nodule.

## 2023-05-07 IMAGING — US US THYROID
1 series · 14 of 25 positions shown · non-contrast
Comparison: 09/12/2020

CLINICAL DATA: Nodule. Previous FNA biopsy of mid right nodule
09/21/2020.

EXAM:
THYROID ULTRASOUND
TECHNIQUE: Ultrasound examination of the thyroid gland and adjacent soft
tissues was performed.

[Series 1: us thyroid · 0.06mm/px · 14 of 52 slices shown]
[im 1/52]
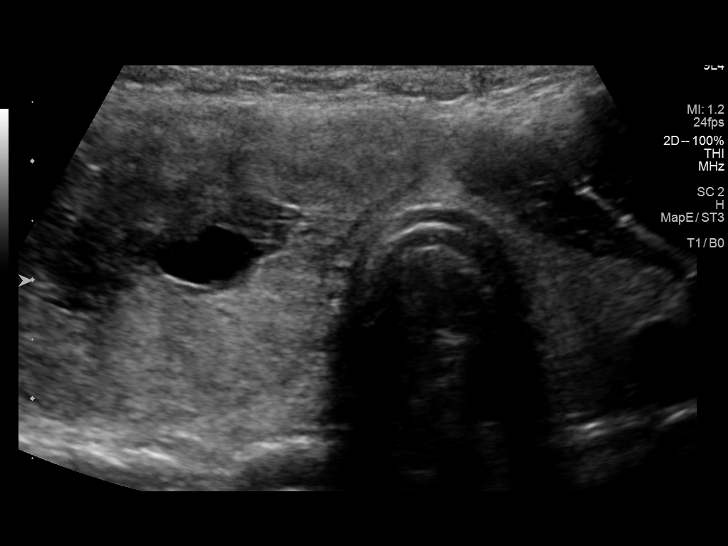
[im 5/52]
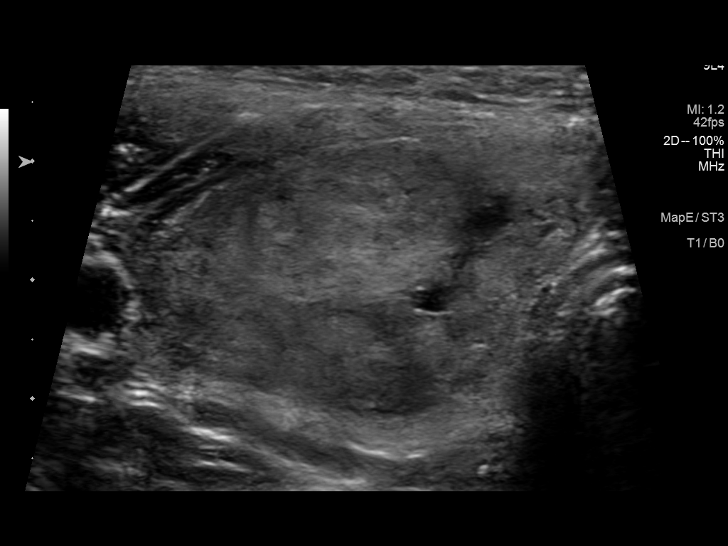
[im 9/52]
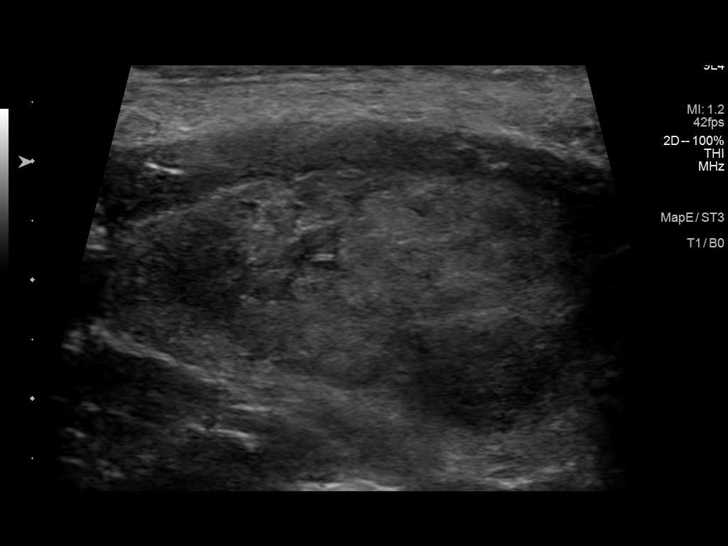
[im 13/52]
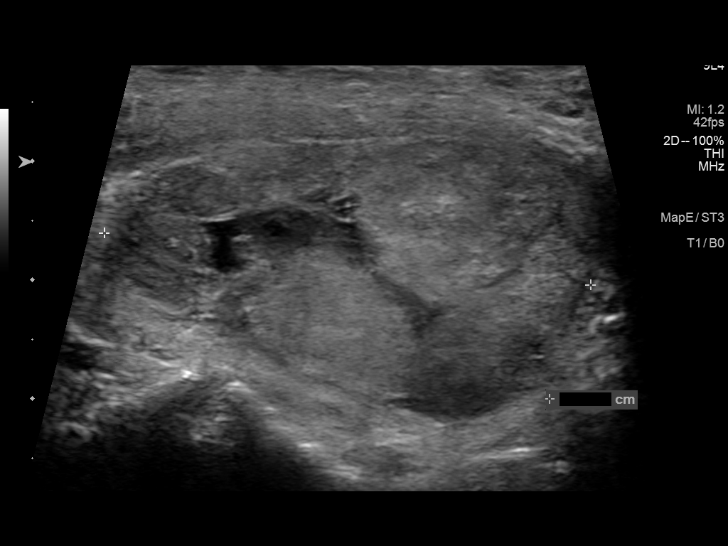
[im 18/52]
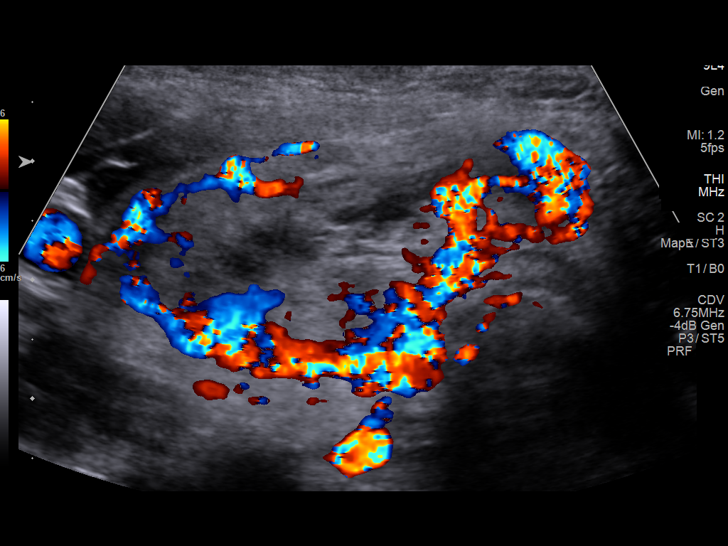
[im 20/52]
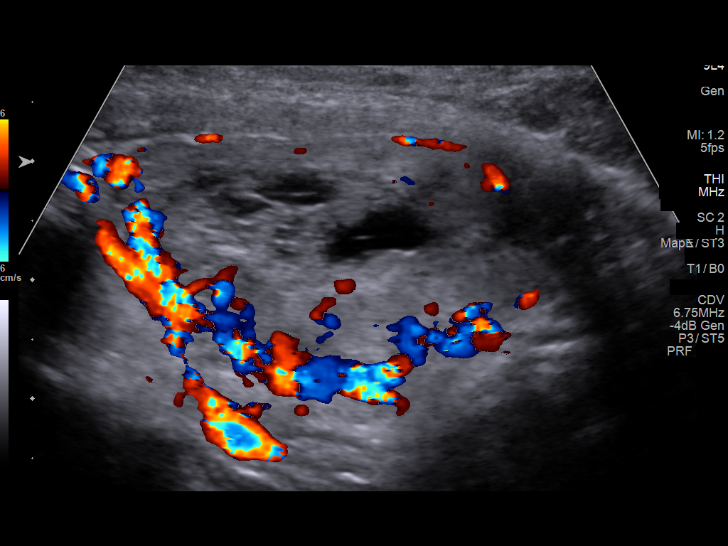
[im 24/52]
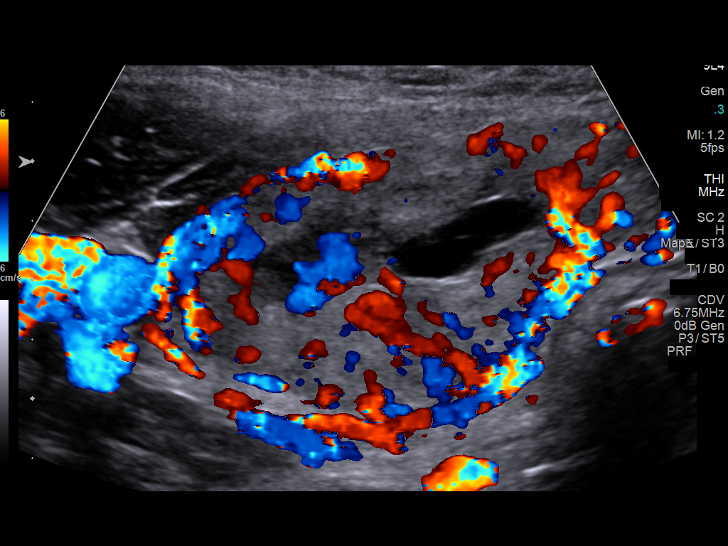
[im 28/52]
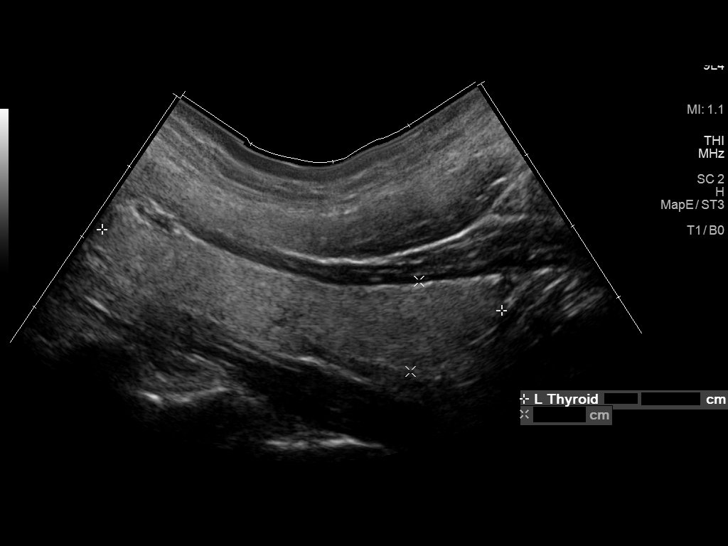
[im 32/52]
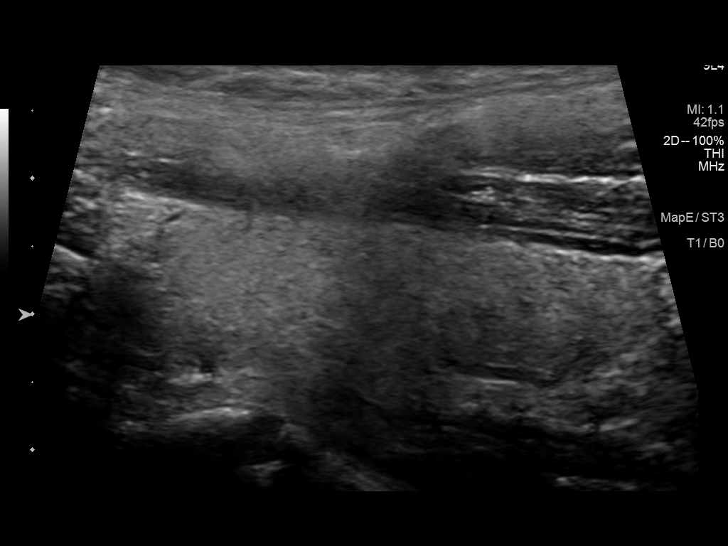
[im 35/52]
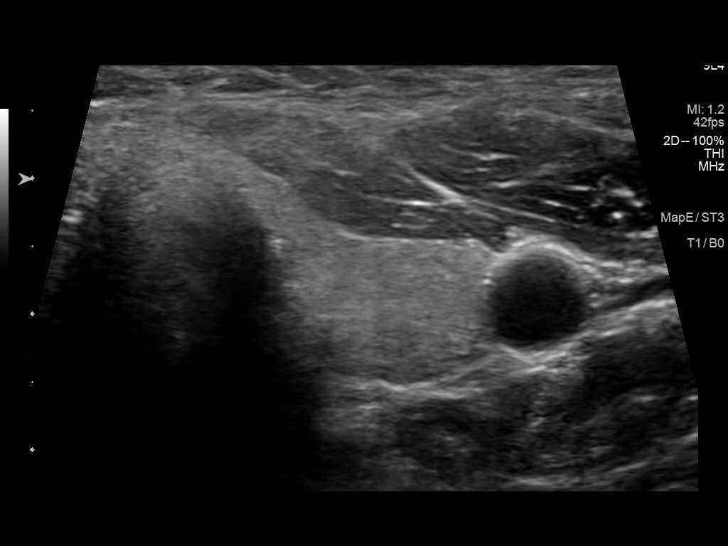
[im 39/52]
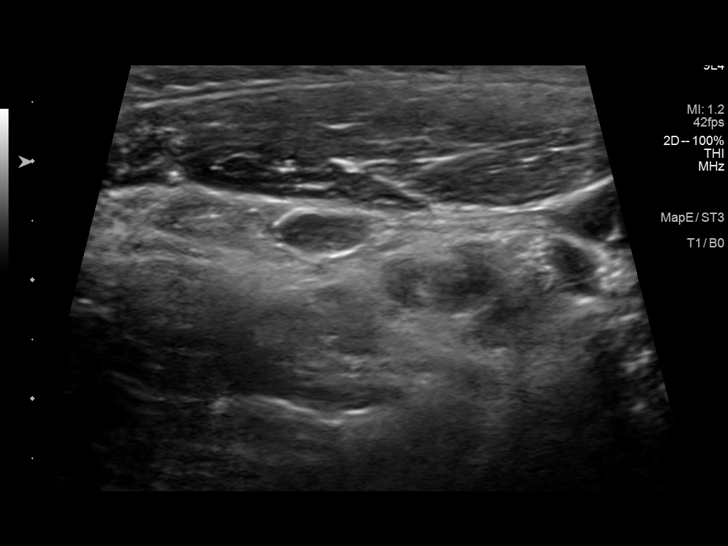
[im 43/52]
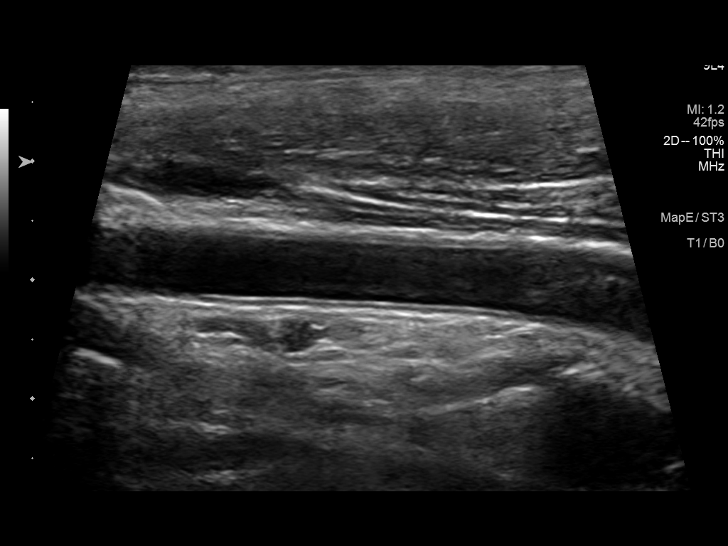
[im 47/52]
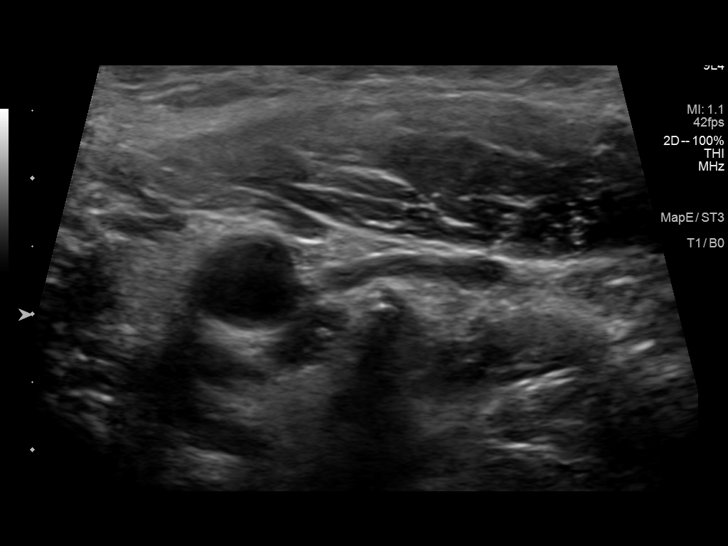
[im 52/52]
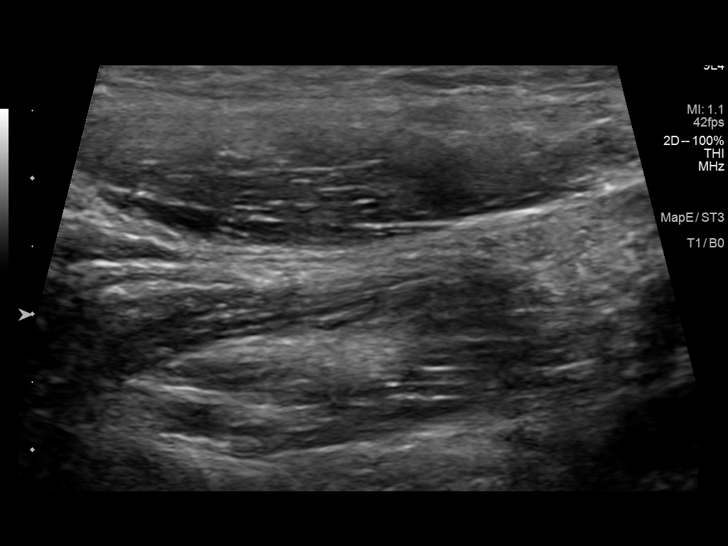

[14 of 25 positions shown; findings below may reference images not displayed]

FINDINGS: Parenchymal Echotexture: Moderately heterogenous

Isthmus: 0.3 cm thickness, previously

Right lobe: 5.1 x 2.8 x 3.5 cm, previously 5.3 x 2.3 x

Left lobe: 4.8 x 1.1 x 1.6 cm, previously 4.4 x 0.9 x

_________________________________________________________

Estimated total number of nodules >/= 1 cm: 1

Number of spongiform nodules >/=  2 cm not described below (TR1): 0

Number of mixed cystic and solid nodules >/= 1.5 cm not described
below (TR2): 0

_________________________________________________________

Nodule 1: 4.1 x 2.4 x 4.1 cm mid right, previously 3.7 x 1.9 x 3.2;
this was previously biopsied.

No regional cervical adenopathy identified.
IMPRESSION: 1. Solitary right thyroid nodule,  previously biopsied.

The above is in keeping with the ACR TI-RADS recommendations - [HOSPITAL] 2605;[DATE].

## 2024-05-31 ENCOUNTER — Other Ambulatory Visit: Payer: Self-pay | Admitting: Internal Medicine

## 2024-05-31 DIAGNOSIS — E079 Disorder of thyroid, unspecified: Secondary | ICD-10-CM

## 2024-06-01 ENCOUNTER — Ambulatory Visit
Admission: RE | Admit: 2024-06-01 | Discharge: 2024-06-01 | Disposition: A | Discharge status: Home / Self Care | Source: Ambulatory Visit | Attending: Internal Medicine | Admitting: Internal Medicine

## 2024-06-01 DIAGNOSIS — E079 Disorder of thyroid, unspecified: Secondary | ICD-10-CM

## 2024-09-12 ENCOUNTER — Encounter: Payer: Self-pay | Admitting: Emergency Medicine

## 2024-09-12 ENCOUNTER — Ambulatory Visit
Admission: EM | Admit: 2024-09-12 | Discharge: 2024-09-12 | Disposition: A | Discharge status: Home / Self Care | Attending: Internal Medicine | Admitting: Internal Medicine

## 2024-09-12 ENCOUNTER — Ambulatory Visit: Payer: Self-pay

## 2024-09-12 DIAGNOSIS — B9689 Other specified bacterial agents as the cause of diseases classified elsewhere: Secondary | ICD-10-CM | POA: Diagnosis not present

## 2024-09-12 DIAGNOSIS — B379 Candidiasis, unspecified: Secondary | ICD-10-CM | POA: Diagnosis not present

## 2024-09-12 DIAGNOSIS — T3695XA Adverse effect of unspecified systemic antibiotic, initial encounter: Secondary | ICD-10-CM

## 2024-09-12 DIAGNOSIS — J019 Acute sinusitis, unspecified: Secondary | ICD-10-CM

## 2024-09-12 DIAGNOSIS — R051 Acute cough: Secondary | ICD-10-CM

## 2024-09-12 MED ORDER — AMOXICILLIN-POT CLAVULANATE 875-125 MG PO TABS: 1.0000 | ORAL_TABLET | Freq: Two times a day (BID) | ORAL | 0 refills | Status: AC

## 2024-09-12 MED ORDER — GUAIFENESIN ER 600 MG PO TB12: 600.0000 mg | ORAL_TABLET | Freq: Two times a day (BID) | ORAL | 0 refills | Status: AC

## 2024-09-12 MED ORDER — PROMETHAZINE-DM 6.25-15 MG/5ML PO SYRP: 5.0000 mL | ORAL_SOLUTION | Freq: Every evening | ORAL | 0 refills | Status: AC | PRN

## 2024-09-12 MED ORDER — FLUCONAZOLE 150 MG PO TABS: 150.0000 mg | ORAL_TABLET | ORAL | 0 refills | Status: AC

## 2024-09-12 NOTE — Discharge Instructions (Signed)
 Take antibiotic sent to pharmacy as directed to treat sinus infection. Tylenol as needed for sinus pain, fevers/chills, etc.  Purchase Mucinex over the counter and take this every 12 hours as needed for nasal congestion and cough. Cough medicines as needed. Warm compresses to the cheeks and forehead as needed to help with sinus headaches as well as tylenol as needed.  If you develop any new or worsening symptoms or if your symptoms do not start to improve, please return here or follow-up with your primary care provider. If your symptoms are severe, please go to the emergency room.

## 2024-09-12 NOTE — ED Provider Notes (Signed)
 [facility]    CSN: [id number] Arrival date & time: 09/12/24  1332      History   Chief Complaint Chief Complaint  Patient presents with   Nasal Congestion    HPI Leslie WICKLIFFE is a 53 y.o. female.   Leslie Ellison is a 53 y.o. female presenting for chief complaint of cough, nasal congestion, generalized headache, generalized fatigue, and facial pressure/sinus pressure that started 12 days ago.  Cough minimally productive and worse at nighttime.  Nasal congestion started out as yellow and has now become more clear with some yellow mucus remaining.  Denies fever, chills, nausea, vomiting, shortness of breath, chest pain, heart palpitations, rashes, and recent antibiotic or steroid use.  Never smoker, denies history of asthma/COPD.  Taking multiple different over-the-counter medications such as Alka-Seltzer, Sudafed, DayQuil, Tylenol , etc. with minimal relief of symptoms.     Past Medical History:  Diagnosis Date   Carpal tunnel syndrome on right    GERD (gastroesophageal reflux disease)    Tremor of right hand    Trigger thumb of right hand     Patient Active Problem List   Diagnosis Date Noted   Need for influenza vaccination 09/06/2021   Cerumen impaction 01/26/2015   Allergic sinusitis 01/26/2015   Tremor 04/13/2014    Past Surgical History:  Procedure Laterality Date   None      OB History   No obstetric history on file.      Home Medications    Prior to Admission medications   Medication Sig Start Date End Date Taking? Authorizing Provider  amoxicillin -clavulanate (AUGMENTIN) 875-125 MG tablet Take 1 tablet by mouth every 12 (twelve) hours. 09/12/24  Yes Enedelia Dorna HERO, FNP  fluconazole (DIFLUCAN) 150 MG tablet Take 1 tablet (150 mg total) by mouth every 3 (three) days. 09/12/24  Yes Enedelia Dorna HERO, FNP  guaiFENesin  (MUCINEX ) 600 MG 12 hr tablet Take 1 tablet (600 mg total) by mouth 2 (two) times daily. 09/12/24  Yes  Enedelia Dorna HERO, FNP  promethazine -dextromethorphan  (PROMETHAZINE -DM) 6.25-15 MG/5ML syrup Take 5 mLs by mouth at bedtime as needed for cough. 09/12/24  Yes Enedelia Dorna HERO, FNP  norgestrel-ethinyl estradiol (CRYSELLE-28) 0.3-30 MG-MCG tablet Cryselle (28) 0.3 mg-30 mcg tablet Patient not taking: Reported on 09/12/2024    [provider]  cetirizine  (ZYRTEC ) 10 MG tablet Take 1 tablet (10 mg total) by mouth at bedtime. 06/21/15 10/16/19  Loreli Elyn SAILOR, MD  fluticasone  (FLONASE ) 50 MCG/ACT nasal spray Place 2 sprays into both nostrils daily. Patient not taking: Reported on 06/21/2015 01/26/15 10/16/19  Marquette Ozell BIRCH, DO  propranolol  ER (INDERAL  LA) 160 MG SR capsule Take 1 capsule (160 mg total) by mouth daily. Patient not taking: Reported on 06/19/2015 08/02/14 10/16/19  Arlys Maude PARAS, DO  ranitidine  (ZANTAC ) 150 MG capsule Take 1 capsule (150 mg total) by mouth 2 (two) times daily. 06/21/15 10/16/19  Loreli Elyn SAILOR, MD    Family History Family History  Problem Relation Age of Onset   Hypertension Mother    Hypertension Father    Diabetes Brother     Social History Social History   Tobacco Use   Smoking status: Never   Smokeless tobacco: Never  Vaping Use   Vaping status: Some Days   Substances: Flavoring  Substance Use Topics   Alcohol use: Yes    Comment: occasional   Drug use: No     Allergies   Patient has no known allergies.   Review  of Systems Review of Systems Per HPI  Physical Exam Triage Vital Signs ED Triage Vitals  Encounter Vitals Group     BP 09/12/24 1401 134/81     Girls Systolic BP Percentile --      Girls Diastolic BP Percentile --      Boys Systolic BP Percentile --      Boys Diastolic BP Percentile --      Pulse Rate 09/12/24 1401 81     Resp 09/12/24 1401 17     Temp 09/12/24 1401 98.2 F (36.8 C)     Temp Source 09/12/24 1401 Oral     SpO2 09/12/24 1401 98 %     Weight --      Height --      Head Circumference --       Peak Flow --      Pain Score 09/12/24 1404 0     Pain Loc --      Pain Education --      Exclude from Growth Chart --    No data found.  Updated Vital Signs BP 134/81 (BP Location: Right Arm)   Pulse 81   Temp 98.2 F (36.8 C) (Oral)   Resp 17   SpO2 98%   Visual Acuity Right Eye Distance:   Left Eye Distance:   Bilateral Distance:    Right Eye Near:   Left Eye Near:    Bilateral Near:     Physical Exam Vitals and nursing note reviewed.  Constitutional:      Appearance: She is not ill-appearing or toxic-appearing.  HENT:     Head: Normocephalic and atraumatic.     Right Ear: Hearing, tympanic membrane, ear canal and external ear normal.     Left Ear: Hearing, tympanic membrane, ear canal and external ear normal.     Nose: Congestion present.     Right Sinus: Maxillary sinus tenderness present. No frontal sinus tenderness.     Left Sinus: Maxillary sinus tenderness present. No frontal sinus tenderness.     Mouth/Throat:     Lips: Pink.     Mouth: Mucous membranes are moist. No injury or oral lesions.     Dentition: Normal dentition.     Tongue: No lesions.     Pharynx: Oropharynx is clear. Uvula midline. No pharyngeal swelling, oropharyngeal exudate, posterior oropharyngeal erythema, uvula swelling or postnasal drip.     Tonsils: No tonsillar exudate.  Eyes:     General: Lids are normal. Vision grossly intact. Gaze aligned appropriately.     Extraocular Movements: Extraocular movements intact.     Conjunctiva/sclera: Conjunctivae normal.  Neck:     Trachea: Trachea and phonation normal.  Cardiovascular:     Rate and Rhythm: Normal rate and regular rhythm.     Heart sounds: Normal heart sounds, S1 normal and S2 normal.  Pulmonary:     Effort: Pulmonary effort is normal. No respiratory distress.     Breath sounds: Normal breath sounds and air entry.  Musculoskeletal:     Cervical back: Neck supple.  Lymphadenopathy:     Cervical: No cervical adenopathy.   Skin:    General: Skin is warm and dry.     Capillary Refill: Capillary refill takes less than 2 seconds.     Findings: No rash.  Neurological:     General: No focal deficit present.     Mental Status: She is alert and oriented to person, place, and time. Mental status is at baseline.  Cranial Nerves: No dysarthria or facial asymmetry.  Psychiatric:        Mood and Affect: Mood normal.        Speech: Speech normal.        Behavior: Behavior normal.        Thought Content: Thought content normal.        Judgment: Judgment normal.      UC Treatments / Results  Labs (all labs ordered are listed, but only abnormal results are displayed) Labs Reviewed - No data to display  EKG   Radiology No results found.  Procedures Procedures (including critical care time)  Medications Ordered in UC Medications - No data to display  Initial Impression / Assessment and Plan / UC Course  I have reviewed the triage vital signs and the nursing notes.  Pertinent labs & imaging results that were available during my care of the patient were reviewed by me and considered in my medical decision making (see chart for details).   1.  Acute bacterial sinusitis, acute cough, antibiotic induced yeast infection Presentation is consistent with acute postviral bacterial sinusitis.   Symptoms have been present for greater than 12 days and have not responded well to over-the-counter therapies, therefore may have antibiotic.  Prescriptions for further symptomatic relief sent, may continue using OTC medications as needed. Deferred imaging of the chest based on stable cardiopulmonary exam and hemodynamically stable vital signs. Recommend warm compresses to the sinuses as needed.   History of antibiotic induced vaginal yeast infection, Diflucan ordered.  Counseled patient on potential for adverse effects with medications prescribed/recommended today, strict ER and return-to-clinic precautions discussed,  patient verbalized understanding.    Final Clinical Impressions(s) / UC Diagnoses   Final diagnoses:  Acute bacterial sinusitis  Acute cough  Antibiotic-induced yeast infection     Discharge Instructions      Take antibiotic sent to pharmacy as directed to treat sinus infection. Tylenol  as needed for sinus pain, fevers/chills, etc.  Purchase Mucinex  over the counter and take this every 12 hours as needed for nasal congestion and cough. Cough medicines as needed. Warm compresses to the cheeks and forehead as needed to help with sinus headaches as well as tylenol  as needed.  If you develop any new or worsening symptoms or if your symptoms do not start to improve, please return here or follow-up with your primary care provider. If your symptoms are severe, please go to the emergency room.     ED Prescriptions     Medication Sig Dispense Auth. Provider   amoxicillin -clavulanate (AUGMENTIN) 875-125 MG tablet Take 1 tablet by mouth every 12 (twelve) hours. 14 tablet Enedelia Going M, FNP   fluconazole (DIFLUCAN) 150 MG tablet Take 1 tablet (150 mg total) by mouth every 3 (three) days. 2 tablet Enedelia Going HERO, FNP   guaiFENesin  (MUCINEX ) 600 MG 12 hr tablet Take 1 tablet (600 mg total) by mouth 2 (two) times daily. 20 tablet Jacilyn Sanpedro M, FNP   promethazine -dextromethorphan  (PROMETHAZINE -DM) 6.25-15 MG/5ML syrup Take 5 mLs by mouth at bedtime as needed for cough. 118 mL Enedelia Going HERO, FNP      PDMP not reviewed this encounter.   Enedelia Going HERO, [place] 09/12/24 1437

## 2024-09-12 NOTE — ED Triage Notes (Signed)
 Pt c/o congestion and some cough for about 1.5-2weeks.   States she got flu shot first of OCT
